# Patient Record
Sex: Male | Born: 1990 | Race: White | Hispanic: No | Marital: Married | Smoking: Never smoker
Health system: Southern US, Community
[De-identification: ages and names within clinical notes are randomized; demographics above are authoritative.]

## PROBLEM LIST (undated history)

## (undated) DIAGNOSIS — F988 Other specified behavioral and emotional disorders with onset usually occurring in childhood and adolescence: Secondary | ICD-10-CM

## (undated) DIAGNOSIS — E785 Hyperlipidemia, unspecified: Secondary | ICD-10-CM

## (undated) DIAGNOSIS — G43909 Migraine, unspecified, not intractable, without status migrainosus: Secondary | ICD-10-CM

## (undated) DIAGNOSIS — E790 Hyperuricemia without signs of inflammatory arthritis and tophaceous disease: Secondary | ICD-10-CM

## (undated) HISTORY — DX: Migraine, unspecified, not intractable, without status migrainosus: G43.909

## (undated) HISTORY — DX: Hyperuricemia without signs of inflammatory arthritis and tophaceous disease: E79.0

## (undated) HISTORY — DX: Other specified behavioral and emotional disorders with onset usually occurring in childhood and adolescence: F98.8

## (undated) HISTORY — DX: Hyperlipidemia, unspecified: E78.5

## (undated) HISTORY — PX: WISDOM TOOTH EXTRACTION: SHX21

---
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
## 2010-04-15 ENCOUNTER — Ambulatory Visit
Admission: RE | Admit: 2010-04-15 | Discharge: 2010-04-15 | Payer: Self-pay | Source: Home / Self Care | Attending: Internal Medicine | Admitting: Internal Medicine

## 2010-04-15 DIAGNOSIS — G43909 Migraine, unspecified, not intractable, without status migrainosus: Secondary | ICD-10-CM | POA: Insufficient documentation

## 2010-04-15 DIAGNOSIS — F988 Other specified behavioral and emotional disorders with onset usually occurring in childhood and adolescence: Secondary | ICD-10-CM | POA: Insufficient documentation

## 2010-04-16 ENCOUNTER — Telehealth: Payer: Self-pay | Admitting: Internal Medicine

## 2010-04-16 ENCOUNTER — Encounter: Payer: Self-pay | Admitting: Internal Medicine

## 2010-05-07 NOTE — Assessment & Plan Note (Signed)
Summary: new to est//migraines//ADHD//lch   Vital Signs:  Patient profile:   20 year old male Height:      72.5 inches Weight:      167.2 pounds BMI:     22.45 Temp:     98.3 degrees F oral Pulse rate:   72 / minute Resp:     14 per minute BP sitting:   116 / 70  (left arm) Cuff size:   large  Vitals Entered By: Shonna Chock CMA (April 15, 2010 3:57 PM) CC: New patient establish: Migraines and ADD, Headaches Comments Patient was on 800mg  Ibuprofen in the past for migraines and vyvanse for ADD   CC:  New patient establish: Migraines and ADD and Headaches.  History of Present Illness:    Tyler Zhang is here to nestablish as a new patient; he is asymptomatic.    He last had migraines last week with  nausea, vomiting, tearing of eyes, sinus pressure, photophobia, and phonophobia, but denies sweats and nasal congestion.  The headache is described as intermittent, throbbing  and sharp.  The location of the pain is unilateral on the left.  The headaches are precipitated by food, cinnamon candy.  Prior treatment has included a NSAID.  Prodrome of tingling in wrists & ankles anf "flashing stars" in vision. He does not typically have an aura.                                                                             He previously as on Vyvanse with good response , but he had excessive sweating. On one occasion he required IV fluids in ER.  Concerta did not help. There have been no issuesas to job performance except for speed of job completion.  Preventive Screening-Counseling & Management  Alcohol-Tobacco     Smoking Status: quit  Caffeine-Diet-Exercise     Does Patient Exercise: yes  Current Medications (verified): 1)  None  Allergies (verified): No Known Drug Allergies  Past History:  Past Medical History: Migraines 2008; ADD diagnosed  by official testing 2008   Past Surgical History: Wisdom Teeth Extraction  Family History: Father: HTN, eevated Lipids Mother: migraines, WPW    Siblings: bro: migraines in HS; PGF: MI , died @ 84; M aunt : Lupus; M uncle: DM; P uncles : HTN  Social History: Occupation: Tile Teacher, adult education Single Former Smoker Alcohol use-yes Regular exercise-yes Smoking Status:  quit Does Patient Exercise:  yes  Review of Systems General:  Denies loss of appetite and sleep disorder; Intermittent fatigue. Eyes:  Denies blurring, double vision, and vision loss-both eyes. ENT:  Denies difficulty swallowing and hoarseness. CV:  Denies lightheadness, near fainting, and palpitations; EKG negative < 4 months ago for exertional  chest pain. GI:  Denies constipation and diarrhea. Derm:  Denies changes in nail beds, dryness, and hair loss. Neuro:  Denies memory loss and numbness. Psych:  Denies anxiety and depression. Endo:  Denies cold intolerance, excessive hunger, excessive thirst, excessive urination, and heat intolerance.  Physical Exam  General:  well-nourished,in no acute distress; alert,appropriate and cooperative throughout examination Head:  Normocephalic and atraumatic without obvious abnormalities. No apparent alopecia or balding. Eyes:  No corneal or conjunctival inflammation noted. EOMI. Perrla.  Field of Vision grossly normal. Mouth:  Oral mucosa and oropharynx without lesions or exudates.  Teeth in good repair. No tongue  Neck:  No deformities, masses, or tenderness noted. Lungs:  Normal respiratory effort, chest expands symmetrically. Lungs are clear to auscultation, no crackles or wheezes. Heart:  Normal rate and regular rhythm. S1 and S2 normal without gallop, murmur,  rub . Click vs loud S4 @ apex Abdomen:  Bowel sounds positive,abdomen soft and non-tender without masses, organomegaly or hernias noted. Pulses:  R and L carotid,radial,femoral,dorsalis pedis and posterior tibial pulses are full and equal bilaterally Extremities:  No clubbing, cyanosis, edema, or deformity noted with normal full range of motion of all joints.   No  onycholysis Neurologic:  alert & oriented X3, cranial nerves II-XII intact, strength normal in all extremities, sensation intact to light touch, and DTRs symmetrical and normal.   Skin:  tatooes chest & LUE Cervical Nodes:  No lymphadenopathy noted Axillary Nodes:  No palpable lymphadenopathy Psych:  memory intact for recent and remote, normally interactive, and good eye contact.     Impression & Recommendations:  Problem # 1:  MIGRAINE HEADACHE (ICD-346.90)  His updated medication list for this problem includes:    Sumatriptan Succinate 100 Mg Tabs (Sumatriptan succinate) .Marland Kitchen... 1 once daily as needed for migraine  Problem # 2:  ADD (ICD-314.00)  Complete Medication List: 1)  Sumatriptan Succinate 100 Mg Tabs (Sumatriptan succinate) .Marland Kitchen.. 1 once daily as needed for migraine 2)  Amphetamine-dextroamphetamine 10 Mg Tabs (Amphetamine-dextroamphetamine) .Marland Kitchen.. 1 once daily as needed , nay repeat 4-6 hrs later  Patient Instructions: 1)  Keep Headache Diary; Excedrin Migraine as needed with PRODROME only. Obtain medical records from ERs. Prescriptions: AMPHETAMINE-DEXTROAMPHETAMINE 10 MG TABS (AMPHETAMINE-DEXTROAMPHETAMINE) 1 once daily as needed , nay repeat 4-6 hrs later  #60 x 0   Entered and Authorized by:   Marga Melnick MD   Signed by:   Marga Melnick MD on 04/15/2010   Method used:   Print then Give to Patient   RxID:   [id number] SUMATRIPTAN SUCCINATE 100 MG TABS (SUMATRIPTAN SUCCINATE) 1 once daily as needed for migraine  #6 x 2   Entered and Authorized by:   Marga Melnick MD   Signed by:   Marga Melnick MD on 04/15/2010   Method used:   Print then Give to Patient   RxID:   [id number] AMPHETAMINE-DEXTROAMPHETAMINE 10 MG TABS (AMPHETAMINE-DEXTROAMPHETAMINE) 1 once daily as needed , nay repeat 4-6 hrs later  #60 x 0   Entered and Authorized by:   Marga Melnick MD   Signed by:   Marga Melnick MD on 04/15/2010   Method used:   Print then Give to Patient   RxID:    [id number] SUMATRIPTAN SUCCINATE 100 MG TABS (SUMATRIPTAN SUCCINATE) 1 once daily as needed for migraine  #6 x 2   Entered and Authorized by:   Marga Melnick MD   Signed by:   Marga Melnick MD on 04/15/2010   Method used:   Print then Give to Patient   RxID:   [id number]    Orders Added: 1)  New Patient Level IV [64332]

## 2010-05-07 NOTE — Medication Information (Signed)
Summary: PA and Approval for Amphetamine Salt Combo  PA and Approval for Amphetamine Salt Combo   Imported By: Maryln Gottron 04/29/2010 15:38:41  _____________________________________________________________________  External Attachment:    Type:   Image     Comment:   External Document

## 2010-05-07 NOTE — Progress Notes (Signed)
Summary: Prior Auth   Phone Note Refill Request Call back at [phone] Message from:  Pharmacy on April 16, 2010 8:40 AM  Refills Requested: Medication #1:  AMPHETAMINE-DEXTROAMPHETAMINE 10 MG TABS 1 once daily as needed   Dosage confirmed as above?Dosage Confirmed Prior Auth from [employer] on [address]   Initial call taken by: Harold Barban,  April 16, 2010 8:40 AM  Follow-up for Phone Call        forms requested. Lucious Groves CMA  April 16, 2010 9:47 AM   Form was completed and faxed, will await insurance company reply Lucious Groves [facility]  April 16, 2010 3:32 PM      Appended Document: Prior Auth  Approved until 04/2011.

## 2010-05-11 ENCOUNTER — Telehealth: Payer: Self-pay | Admitting: Internal Medicine

## 2010-05-12 ENCOUNTER — Telehealth: Payer: Self-pay | Admitting: Internal Medicine

## 2010-05-21 NOTE — Progress Notes (Signed)
Summary: prior auth---Adderall 30mg   Phone Note Refill Request Message from:  Fax from Pharmacy on May 12, 2010 8:55 AM  Refills Requested: Medication #1:  AMPHETAMINE-DEXTROAMPHETAMINE 30 MG XR24H-CAP 1 once daily as needed. prior Berkley Harvey [phone]  - id [id number]  Initial call taken by: Okey Regal [place],  May 12, 2010 8:57 AM  Follow-up for Phone Call        I spoke with insurance company and no additional prior authorization is needed. Previous prior authorization for 10mg  tab was updated. Case #[id number]. Follow-up by: Lucious Groves CMA,  May 12, 2010 9:26 AM

## 2010-05-21 NOTE — Progress Notes (Signed)
Summary: Adderall rx/adjustment  Phone Note Call from Patient Call back at [phone]   Caller: Robinette Haines Summary of Call: Patient mom called noting that the patient Adderall needs to be adjusted and refilled. She notes that he has taking 2 10mg  q AM and 2 10mg  at lunch and it does not last all day. The patient notes that by 4pm he cannot focus.  Patient ran out on Friday. Please advise. Initial call taken by: Lucious Groves CMA,  May 11, 2010 11:28 AM  Follow-up for Phone Call        Am dose increased & changed to sustained release form; short acting form  to stay same Follow-up by: Marga Melnick MD,  May 11, 2010 11:56 AM  Additional Follow-up for Phone Call Additional follow up Details #1::        see Rxs Additional Follow-up by: Marga Melnick MD,  May 11, 2010 11:57 AM    Additional Follow-up for Phone Call Additional follow up Details #2::    Patient mom notified and she will send her mother to come get them for the patient. Follow-up by: Lucious Groves CMA,  May 11, 2010 1:45 PM  New/Updated Medications: AMPHETAMINE-DEXTROAMPHETAMINE 10 MG TABS (AMPHETAMINE-DEXTROAMPHETAMINE) 1- 2 each afternoon  as needed AMPHETAMINE-DEXTROAMPHETAMINE 30 MG XR24H-CAP (AMPHETAMINE-DEXTROAMPHETAMINE) 1 once daily as needed Prescriptions: AMPHETAMINE-DEXTROAMPHETAMINE 10 MG TABS (AMPHETAMINE-DEXTROAMPHETAMINE) 1- 2 each afternoon  as needed  #60 x 0   Entered and Authorized by:   Marga Melnick MD   Signed by:   Marga Melnick MD on 05/11/2010   Method used:   Print then Give to Patient   RxID:   [id number] AMPHETAMINE-DEXTROAMPHETAMINE 30 MG XR24H-CAP (AMPHETAMINE-DEXTROAMPHETAMINE) 1 once daily as needed  #30 x 0   Entered and Authorized by:   Marga Melnick MD   Signed by:   Marga Melnick MD on 05/11/2010   Method used:   Print then Give to Patient   RxID:   [id number]

## 2010-05-28 ENCOUNTER — Encounter: Payer: Self-pay | Admitting: Internal Medicine

## 2010-05-28 ENCOUNTER — Other Ambulatory Visit: Payer: Self-pay | Admitting: Internal Medicine

## 2010-05-28 ENCOUNTER — Ambulatory Visit (INDEPENDENT_AMBULATORY_CARE_PROVIDER_SITE_OTHER): Payer: BC Managed Care – PPO | Admitting: Internal Medicine

## 2010-05-28 DIAGNOSIS — F988 Other specified behavioral and emotional disorders with onset usually occurring in childhood and adolescence: Secondary | ICD-10-CM

## 2010-05-28 DIAGNOSIS — R079 Chest pain, unspecified: Secondary | ICD-10-CM | POA: Insufficient documentation

## 2010-05-28 DIAGNOSIS — K219 Gastro-esophageal reflux disease without esophagitis: Secondary | ICD-10-CM

## 2010-05-28 DIAGNOSIS — Z8249 Family history of ischemic heart disease and other diseases of the circulatory system: Secondary | ICD-10-CM

## 2010-05-28 DIAGNOSIS — G43909 Migraine, unspecified, not intractable, without status migrainosus: Secondary | ICD-10-CM

## 2010-05-29 LAB — CONVERTED CEMR LAB: Troponin I: <0.01 ng/mL (ref <0.06)

## 2010-06-01 ENCOUNTER — Other Ambulatory Visit: Payer: BC Managed Care – PPO

## 2010-06-02 NOTE — Assessment & Plan Note (Signed)
Summary: Chest/Rib Pain/ cbs   Vital Signs:  Patient profile:   20 year old male Weight:      164.4 pounds BMI:     22.07 Temp:     98.1 degrees F oral Pulse rate:   92 / minute Resp:     14 per minute BP sitting:   128 / 88  (right arm) Cuff size:   large  Vitals Entered By: Shonna Chock CMA (May 28, 2010 2:15 PM) CC: 1.) Rib pain /chest pain x several years, pain worse x 1 month . Pain will onset for weeks at a time then go away.  2.) Lip concerns-bleeding x 2-3 weeks off/on and painful , Heartburn   CC:  1.) Rib pain /chest pain x several years, pain worse x 1 month . Pain will onset for weeks at a time then go away.  2.) Lip concerns-bleeding x 2-3 weeks off/on and painful , and Heartburn.  History of Present Illness:    Exacerbation  in early 04/2010 when pain became daily phenomenon ; he has had intermittent less severe pain  for 3-4 years , assumed to be from  high school football injury. The 12/[address] ER record reviewed ; that episode was associated with SOB  &  cp radiating to LUE during exercise. Now the pain is described as intermittent and pressure-like  in the substernal area.  The pain radiates to the back where it is burning.  Episodes of chest pain last  hours.  The pain is brought on or made worse by upper body movement, especially waist flexion .  The pain is relieved or improved with  ice to chest & back.His mother has had WPW; father HTN  & PGF MI @ 20. No syndrome of cp, flushing , headache & diarrhea.    He also  describes heartburn.  The patient reports acid reflux and   trouble swallowing 2 X/ week on average , but denies epigastric pain.  The patient denies the following alarm features: melena, dysphagia, hematemesis, and vomiting.                                                                                                                                He has had some burning inside his lower lip w/o visable  tissue change. He uses smokeless tobacco  regularly.  Current Medications (verified): 1)  Sumatriptan Succinate 100 Mg Tabs (Sumatriptan Succinate) .Marland Kitchen.. 1 Once Daily As Needed For Migraine 2)  Amphetamine-Dextroamphetamine 10 Mg Tabs (Amphetamine-Dextroamphetamine) .Marland Kitchen.. 1- 2 Each Afternoon  As Needed 3)  Amphetamine-Dextroamphetamine 30 Mg Xr24h-Cap (Amphetamine-Dextroamphetamine) .Marland Kitchen.. 1 Once Daily As Needed  Allergies (verified): No Known Drug Allergies  Family History: Father: HTN, elevated Lipids Mother: migraines, WPW  Siblings: bro: migraines in HS; PGF: MI , died @ 1; M aunt : Lupus; M uncle: DM; P uncles : HTN  Physical Exam  General:  Thin but well-nourished,in no acute distress;  alert,appropriate and cooperative throughout examination Eyes:  No corneal or conjunctival inflammation noted. EOMI. Perrla. No icterus Mouth:  Oral mucosa and oropharynx without lesions or exudates.  Teeth in good repair. No pharyngeal erythema.  No lip tissue changes Neck:  No deformities, masses, or tenderness noted. Lungs:  Normal respiratory effort, chest expands symmetrically. Lungs are clear to auscultation, no crackles or wheezes. Heart:  Normal rate and regular rhythm. S1 and S2 normal without gallop, murmur,  rub . Mitral click  suggested @ apex. Abdomen:  Bowel sounds positive,abdomen soft and non-tender without masses, organomegaly or hernias noted. Pulses:  R and L carotid,radial,dorsalis pedis and posterior tibial pulses are full and equal bilaterally Extremities:  No clubbing, cyanosis, edema, or deformity noted . Neg Homan's Neurologic:  alert & oriented X3.   Skin:  Intact without suspicious lesions or rashes. No jaundice Cervical Nodes:  No lymphadenopathy noted Axillary Nodes:  No palpable lymphadenopathy Psych:  memory intact for recent and remote, normally interactive, and good eye contact.     Impression & Recommendations:  Problem # 1:  CHEST PAIN (ICD-786.50)  ? Mitral  Click Syndrome  Orders: EKG w/  Interpretation (93000) Venipuncture (16109) TLB-Cardiac Panel ([id number]) T- * Misc. Laboratory test [phone]) Specimen Handling (21308)  Problem # 2:  GERD (ICD-530.81) ? aggravated by smokeless tobacco & caffeine [facility]) His updated medication list for this problem includes:    Omeprazole 20 Mg Cpdr (Omeprazole) .Marland Kitchen... 1 two times a day pre meals  Problem # 3:  MYOCARDIAL INFARCTION, PREMATURE, FAMILY HX (ICD-V17.3) PGF @ age 53  Problem # 4:  ADD (ICD-314.00)  Problem # 5:  MIGRAINE HEADACHE (ICD-346.90)  His updated medication list for this problem includes:    Sumatriptan Succinate 100 Mg Tabs (Sumatriptan succinate) .Marland Kitchen... 1 once daily as needed for migraine  Complete Medication List: 1)  Sumatriptan Succinate 100 Mg Tabs (Sumatriptan succinate) .Marland Kitchen.. 1 once daily as needed for migraine 2)  Amphetamine-dextroamphetamine 10 Mg Tabs (Amphetamine-dextroamphetamine) .Marland Kitchen.. 1- 2 each afternoon  as needed 3)  Amphetamine-dextroamphetamine 30 Mg Xr24h-cap (Amphetamine-dextroamphetamine) .Marland Kitchen.. 1 once daily as needed 4)  Omeprazole 20 Mg Cpdr (Omeprazole) .Marland Kitchen.. 1 two times a day pre meals  Patient Instructions: 1)  Schedule  a fasting [place] [facility] panel ( 786.50, V17.3). 2)  Avoid foods high in acid (tomatoes, citrus juices, spicy foods). Avoid eating within two hours of lying down or before exercising. Do not over eat; try smaller more frequent meals. Elevate head of bed twelve inches when sleeping. See Dentist every 6 months; please consider health risks of smokeless tobacco as discussed . Prescriptions: OMEPRAZOLE 20 MG CPDR (OMEPRAZOLE) 1 two times a day pre meals  #60 x 1   Entered and Authorized by:   Marga Melnick MD   Signed by:   Marga Melnick MD on 05/28/2010   Method used:   Print then Give to Patient   RxID:   [id number]    Orders Added: 1)  Est. Patient Level IV [24401] 2)  EKG w/ Interpretation [93000] 3)  Venipuncture [36415] 4)  TLB-Cardiac Panel  [[id number]] 5)  T- * Misc. Laboratory test [id number] 6)  Specimen Handling [99000]

## 2010-06-18 LAB — CBC
HCT: 42.3 % (ref 39.0–52.0)
MCV: 92.7 fL (ref 78.0–100.0)
Platelets: 204 10*3/uL (ref 150–400)
RBC: 4.56 MIL/uL (ref 4.22–5.81)
WBC: 12.4 10*3/uL — ABNORMAL HIGH (ref 4.0–10.5)

## 2010-06-18 LAB — POCT TOXICOLOGY PANEL

## 2010-06-18 LAB — BASIC METABOLIC PANEL
Chloride: 100 mEq/L (ref 96–112)
GFR calc Af Amer: >60 mL/min (ref >60)
GFR calc non Af Amer: >60 mL/min (ref >60)
Potassium: 4.2 mEq/L (ref 3.5–5.1)

## 2010-06-18 LAB — POCT CARDIAC MARKERS
CKMB, poc: 2.2 ng/mL (ref 1.0–8.0)
Troponin i, poc: <0.05 ng/mL (ref 0.00–0.09)

## 2010-06-18 LAB — DIFFERENTIAL
Eosinophils Relative: 1 % (ref 0–5)
Lymphocytes Relative: 20 % (ref 12–46)
Lymphs Abs: 2.5 10*3/uL (ref 0.7–4.0)
Neutro Abs: 8.6 10*3/uL — ABNORMAL HIGH (ref 1.7–7.7)

## 2010-06-25 ENCOUNTER — Encounter: Payer: Self-pay | Admitting: Internal Medicine

## 2010-07-01 ENCOUNTER — Ambulatory Visit: Payer: BC Managed Care – PPO | Admitting: Internal Medicine

## 2010-07-04 ENCOUNTER — Encounter: Payer: Self-pay | Admitting: Internal Medicine

## 2010-07-06 ENCOUNTER — Ambulatory Visit (INDEPENDENT_AMBULATORY_CARE_PROVIDER_SITE_OTHER): Payer: BC Managed Care – PPO | Admitting: Internal Medicine

## 2010-07-06 ENCOUNTER — Encounter: Payer: Self-pay | Admitting: Internal Medicine

## 2010-07-06 DIAGNOSIS — Z8249 Family history of ischemic heart disease and other diseases of the circulatory system: Secondary | ICD-10-CM

## 2010-07-06 DIAGNOSIS — R079 Chest pain, unspecified: Secondary | ICD-10-CM

## 2010-07-06 DIAGNOSIS — E785 Hyperlipidemia, unspecified: Secondary | ICD-10-CM

## 2010-07-06 MED ORDER — NIACIN ER (ANTIHYPERLIPIDEMIC) 500 MG PO TBCR: 500.0000 mg | EXTENDED_RELEASE_TABLET | Freq: Every day | ORAL | Status: DC

## 2010-07-06 MED ORDER — AMPHETAMINE-DEXTROAMPHETAMINE 10 MG PO TABS: 10.0000 mg | ORAL_TABLET | Freq: Two times a day (BID) | ORAL | Status: DC

## 2010-07-06 MED ORDER — AMPHETAMINE-DEXTROAMPHET ER 30 MG PO CP24: 30.0000 mg | ORAL_CAPSULE | Freq: Every day | ORAL | Status: DC | PRN

## 2010-07-06 NOTE — Progress Notes (Signed)
  Subjective:    Patient ID: Tyler Zhang, male    DOB: 17-Jun-1990, 20 y.o.   MRN: [id number]  HPI He returns to review his [place] [facility] diagnostics panel done to assess long-term cardiovascular risk. This was performed because of a history of atypical recurrent chest pain. Additionally there is an incredibly strong family history  Of premature coronary  artery disease. Specifically his paternal grandfather and 3 paternal great uncles had heart attacks in their 30s.   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 Additionally his uric acid is 9.1 which would be associated with increased risk long term of gout. LDL 73; HDL was 77.   The chest pain continues; he actually needed to apply an ice pack to his chest last night because of discomfort      Review of Systems     Objective:   Physical Exam Heart:  Normal rate and regular rhythm. S1 and S2 normal without gallop, murmur, click, rub .S4  vs  Mitral Click.  No cyanosis, edema, or deformity noted with good pulses.                      Lungs:Chest clear to auscultation; no wheezes, rhonchi,rales ,or rubs present.No increased work of breathing.         Assessment & Plan:  #1 atypical chest pain; today there is suggestion of a possible mitral click  #2 strong family history of premature coronary artery disease with premature death.  #3 elevated Lp(a)  Elevated uric acid  Plan; the role of-induced corn syrup sugar raising uric acid was discussed. A stress echo will be performed to rule out asymptomatic mitral valve prolapse.  Niacin as Niaspan 500 mg daily will be initiated to treat the single risk.

## 2010-07-06 NOTE — Progress Notes (Signed)
Addended by: Stephan Minister on: 07/06/2010 04:08 PM   Modules accepted: Orders

## 2010-07-06 NOTE — Patient Instructions (Signed)
The most common cause of elevated uric acid  is the ingestion of sugar from high fructose corn syrup sources. You should consume less than 40 grams  of sugar per day from foods and drinks with high fructose corn syrup as number 2, 3, or #4 on the label.   Please read the information about Niaspan in detail and decide if he want to take this agent. Will need to be taken with applesauce to prevent flushing which is an expected effect of this vitamin which is a vasodilator.

## 2010-07-15 ENCOUNTER — Telehealth: Payer: Self-pay | Admitting: *Deleted

## 2010-07-15 NOTE — Telephone Encounter (Signed)
Was assisting w/ prior auth for stress test insurance will need for Dr. Alwyn Ren to do a peer to peer before services will be approved the are open til 7pm central time. Pt's appt is scheduled for Friday, April 13th.   [phone] opt 2 has be done before appt.

## 2010-07-15 NOTE — Telephone Encounter (Signed)
The manage care currently has the office notes with the full record &  diagnoses. There is nothing else I can tell them  Which would  Change their  mind. I recommend cardiology evaluation by a physician of their choice.

## 2010-07-17 ENCOUNTER — Other Ambulatory Visit (HOSPITAL_COMMUNITY): Payer: BC Managed Care – PPO | Admitting: Radiology

## 2010-07-17 ENCOUNTER — Ambulatory Visit (INDEPENDENT_AMBULATORY_CARE_PROVIDER_SITE_OTHER): Payer: BC Managed Care – PPO | Admitting: Internal Medicine

## 2010-07-17 ENCOUNTER — Encounter: Payer: Self-pay | Admitting: Internal Medicine

## 2010-07-17 DIAGNOSIS — R000 Tachycardia, unspecified: Secondary | ICD-10-CM

## 2010-07-17 DIAGNOSIS — R079 Chest pain, unspecified: Secondary | ICD-10-CM

## 2010-07-17 MED ORDER — METOPROLOL TARTRATE 25 MG PO TABS: 25.0000 mg | ORAL_TABLET | Freq: Two times a day (BID) | ORAL | Status: AC

## 2010-07-17 NOTE — Patient Instructions (Signed)
Please take the metoprolol 25 mg twice a day as a trial. Please stop it 24 hours before you see the cardiologist. (Note: [phone])

## 2010-07-17 NOTE — Progress Notes (Signed)
  Subjective:    Patient ID: Tyler Zhang, male    DOB: Apr 01, 1991, 20 y.o.   MRN: [id number]  HPI CHEST PAIN  Location: LSB Quality: sharp, aching Duration: "all day 4/12" Onset (rest, exertion): @ rest Radiation: no Better with: no relieveers Worse with: no exacerbating factors  Symptoms  Nausea/vomiting: no  Diaphoresis: no  Shortness of breath: no  Pleuritic: no  Cough: no  Edema: no  Orthopnea: no  PND: no  Dizziness: yes, occassionally with standing  Palpitations: yes, lasted for 2 hrs  Syncope: no  Indigestion: no   Red Flags Worse with exertion: no  Recent Immobility: no  Tearing/radiation to back: no       Review of Systems     Objective:   Physical Exam   He is in no acute distress. Skin is warm and dry.  He has no scleral icterus.  An S4 versus a mitral click is noted at the apex. Chest is clear to auscultation.  All pulses are intact without bruits or deficits.  Homans sign is negative.  Extremities reveal no cyanosis, clubbing, or edema.  There is no chest wall tenderness to palpation.          Assessment & Plan:  #1 atypical chest pain; rule out significant mitral valve prolapse  #2 palpitations lasting hours  Plan: I had requested a stress echo to assess the atypical chest pain and possibly mitral prolapse. Because the preauthorization was required despite submitting complete office notes, I ordered a cardiology consult.  Pending completion of that, I would recommend low-dose beta blockade as a trial.   a cardiac panel will be performed to include CK, CK-MB and troponin 1. Additionally d-dimer will be rechecked.  Cardiology consult was scheduled on April 25 with Dr. Eden Emms

## 2010-07-18 LAB — CK TOTAL AND CKMB (NOT AT ARMC)
CK, MB: 2 ng/mL (ref 0.3–4.0)
Relative Index: 1.7 (ref 0.0–2.5)
Total CK: 117 U/L (ref 7–232)

## 2010-07-18 LAB — TROPONIN I: Troponin I: <0.01 ng/mL (ref <0.06)

## 2010-07-29 ENCOUNTER — Institutional Professional Consult (permissible substitution): Payer: BC Managed Care – PPO | Admitting: Cardiovascular Disease

## 2010-08-12 ENCOUNTER — Encounter: Payer: Self-pay | Admitting: Cardiovascular Disease

## 2010-08-13 ENCOUNTER — Institutional Professional Consult (permissible substitution): Payer: BC Managed Care – PPO | Admitting: Cardiovascular Disease

## 2010-08-27 ENCOUNTER — Ambulatory Visit (INDEPENDENT_AMBULATORY_CARE_PROVIDER_SITE_OTHER): Payer: BC Managed Care – PPO | Admitting: Cardiovascular Disease

## 2010-08-27 ENCOUNTER — Encounter: Payer: Self-pay | Admitting: Cardiovascular Disease

## 2010-08-27 DIAGNOSIS — R079 Chest pain, unspecified: Secondary | ICD-10-CM

## 2010-08-27 NOTE — Assessment & Plan Note (Signed)
Atypical.  I dont hear click or murmur.  ETT and Echo.  Echo to be done by [facility] pediatric tech to see if origins of coronary arteries can be seen.  Other option would be cardiac CT with [facility].  Will see what insurance says and what we can do

## 2010-08-27 NOTE — Progress Notes (Signed)
20 yo referred by Dr Alwyn Ren.  Atypical SSCP over a year.  Can last all day.  No pleuritic component.  Not positional.  No history of trauma.  CXR reviewed from 9/11 normal.  No history of arthritites or connective tissue disease.  Dr Alwyn Ren ? MVP with click and wanted to order a stress echo but denied by insurance. No SOB.  More sedentary last 4 months but no exercise limitations at [employer].  No syncope.  Family history of premature CAD.  Denies drugs, etoh or excessive stimulants.  W/U in ER 9/11 for chest pain negative.  Reviewed ECG from Dr Caryl Never office and it was normal  ROS: Denies fever, malais, weight loss, blurry vision, decreased visual acuity, cough, sputum, SOB, hemoptysis, pleuritic pain, palpitaitons, heartburn, abdominal pain, melena, lower extremity edema, claudication, or rash.   General: Affect appropriate Healthy:  appears stated age HEENT: normal Neck supple with no adenopathy JVP normal no bruits no thyromegaly Lungs clear with no wheezing and good diaphragmatic motion Heart:  S1/S2 no murmur,rub, gallop or click PMI normal Abdomen: benighn, BS positve, no tenderness, no AAA no bruit.  No HSM or HJR Distal pulses intact with no bruits No edema Neuro non-focal Skin warm and dry No muscular weakness  Medications Current Outpatient Prescriptions  Medication Sig Dispense Refill  . amphetamine-dextroamphetamine (ADDERALL XR) 30 MG 24 hr capsule Take 30 mg by mouth daily.        Marland Kitchen amphetamine-dextroamphetamine (ADDERALL) 10 MG tablet Take 1 tablet (10 mg total) by mouth 2 (two) times daily. 1-2 by mouth each afternoon as needed  60 tablet  0  . omeprazole (PRILOSEC) 20 MG capsule Take 20 mg by mouth as needed.       . SUMAtriptan (IMITREX) 100 MG tablet Take 100 mg by mouth daily as needed.        Marland Kitchen DISCONTD: amphetamine-dextroamphetamine (ADDERALL XR) 30 MG 24 hr capsule Take 1 capsule (30 mg total) by mouth daily as needed.  30 capsule  0  . DISCONTD: niacin (NIASPAN) 500  MG CR tablet Take 1 tablet (500 mg total) by mouth at bedtime.  30 tablet  11    Allergies Review of patient's allergies indicates no known allergies.  Family History: Family History  Problem Relation Age of Onset  . Hypertension Father   . Hyperlipidemia Father   . Migraines Mother   . Evelene Croon Parkinson White syndrome Mother   . Migraines Brother   . Heart attack Paternal Grandfather 43  . Lupus Maternal Aunt   . Diabetes Maternal Uncle   . Hypertension Paternal Uncle   . Heart attack      3 PG uncles in their 30s    Social History: History   Social History  . Marital Status: Single    Spouse Name: N/A    Number of Children: N/A  . Years of Education: N/A   Occupational History  . Not on file.   Social History Main Topics  . Smoking status: Never Smoker   . Smokeless tobacco: Not on file  . Alcohol Use: Yes  . Drug Use: No  . Sexually Active: Not on file   Other Topics Concern  . Not on file   Social History Narrative  . No narrative on file     Electrocardiogram:  NSR normal  07/17/10  Assessment and Plan

## 2010-08-27 NOTE — Patient Instructions (Signed)
Schedule an appointment for an echocardiogram.  This should be scheduled with the pediatric echo tech.  Schedule an appointment for a treadmill. This should be with Dr Eden Emms next week.

## 2010-09-16 ENCOUNTER — Ambulatory Visit (INDEPENDENT_AMBULATORY_CARE_PROVIDER_SITE_OTHER): Payer: BC Managed Care – PPO | Admitting: Cardiovascular Disease

## 2010-09-16 DIAGNOSIS — R079 Chest pain, unspecified: Secondary | ICD-10-CM

## 2010-09-16 NOTE — Progress Notes (Signed)
Exercise Treadmill Test  Pre-Exercise Testing Evaluation Rhythm: normal sinus  Rate: 88   PR:  .13 QRS:  .09  QT:  .35 QTc: .43     Test  Exercise Tolerance Test Ordering MD: Charlton Haws, MD  Interpreting MD:  Charlton Haws, MD  Unique Test No: 1  Treadmill:  1  Indication for ETT: chest pain - rule out ischemia  Contraindication to ETT: No   Stress Modality: exercise - treadmill  Cardiac Imaging Performed: non   Protocol: standard Bruce - maximal  Max BP: 128 74  Max MPHR (bpm):  200 85% MPR (bpm):  170  MPHR obtained (bpm):  184 % MPHR obtained:  100  Reached 85% MPHR (min:sec):  8:10 Total Exercise Time (min-sec):  10:00  Workload in METS:  17.2 Borg Scale: 19  Reason ETT Terminated:  fatigue    ST Segment Analysis At Rest: normal ST segments - no evidence of significant ST depression With Exercise: no evidence of significant ST depression  Other Information Arrhythmia:  No Angina during ETT:  absent (0) Quality of ETT:  diagnostic  ETT Interpretation:  normal - no evidence of ischemia by ST analysis Stages advanced at 2 minute intervals  Comments: Normal ETT at good work load  Recommendations: F/U with Echo latter today  Charlton Haws

## 2010-10-06 ENCOUNTER — Encounter: Payer: Self-pay | Admitting: Cardiovascular Disease

## 2010-10-16 ENCOUNTER — Telehealth: Payer: Self-pay | Admitting: Internal Medicine

## 2010-10-16 NOTE — Telephone Encounter (Signed)
Left message on voicemail informing patient's mother that she or Yasin needs to get the ok from Dr.Nishan that patient is ok to take adderall. Dr.Hopper will fill med once that is clarified from the cardiologist .  Patient 's mother to call our office back if questions or concerns

## 2010-10-16 NOTE — Telephone Encounter (Signed)
Dr.Hopper please advise ( I see that patient recently seen Tyler Zhang for Chest pain) ? If ok to fill adderall

## 2010-10-16 NOTE — Telephone Encounter (Signed)
I am very sorry that the Adderall cannot be refilled until this is okayed by Dr. Eden Emms in a, the cardiologist who saw him for chest pain.

## 2010-10-16 NOTE — Telephone Encounter (Signed)
Refill adderall 30 mg & 10 mg - patient hasnt had refill because he lost his job - but now back to work - patient will pick up Monday [id number]

## 2010-10-19 ENCOUNTER — Telehealth: Payer: Self-pay | Admitting: Cardiovascular Disease

## 2010-10-19 NOTE — Telephone Encounter (Signed)
Pt's mother called and per Dr. Alwyn Ren pt has had some testing done under Dr. Fabio Bering care and they need the notes from the test and a note forwarded to him stating it is ok to refill pt's meds

## 2010-10-20 NOTE — Telephone Encounter (Signed)
Spoke with pt, dr hopper's office needs an okay from dr Eden Emms to refill the pts adderall.  Will forward for dr Eden Emms review  Deliah Goody

## 2010-10-20 NOTE — Telephone Encounter (Signed)
Ok to refill addeerall

## 2010-10-20 NOTE — Telephone Encounter (Signed)
Okay to refill Adderall? 

## 2010-10-21 MED ORDER — AMPHETAMINE-DEXTROAMPHET ER 30 MG PO CP24: 30.0000 mg | ORAL_CAPSULE | Freq: Every day | ORAL | Status: DC

## 2010-10-21 MED ORDER — AMPHETAMINE-DEXTROAMPHETAMINE 10 MG PO TABS: 10.0000 mg | ORAL_TABLET | Freq: Two times a day (BID) | ORAL | Status: DC

## 2010-10-21 NOTE — Telephone Encounter (Signed)
Left message on mother's voicemail: RX's will be available for pick-up after 9am. Patient's mother to call if questions or concerns

## 2010-11-16 ENCOUNTER — Other Ambulatory Visit: Payer: Self-pay | Admitting: Orthopedic Surgery

## 2010-11-16 ENCOUNTER — Emergency Department (HOSPITAL_COMMUNITY): Payer: BC Managed Care – PPO

## 2010-11-16 ENCOUNTER — Observation Stay (HOSPITAL_COMMUNITY)
Admission: EM | Admit: 2010-11-16 | Discharge: 2010-11-16 | Disposition: A | Discharge status: Home / Self Care | Payer: BC Managed Care – PPO | Attending: Orthopedic Surgery | Admitting: Orthopedic Surgery

## 2010-11-16 DIAGNOSIS — Y9269 Other specified industrial and construction area as the place of occurrence of the external cause: Secondary | ICD-10-CM | POA: Insufficient documentation

## 2010-11-16 DIAGNOSIS — W298XXA Contact with other powered powered hand tools and household machinery, initial encounter: Secondary | ICD-10-CM | POA: Insufficient documentation

## 2010-11-16 DIAGNOSIS — IMO0002 Reserved for concepts with insufficient information to code with codable children: Principal | ICD-10-CM | POA: Insufficient documentation

## 2010-11-16 DIAGNOSIS — Y990 Civilian activity done for income or pay: Secondary | ICD-10-CM | POA: Insufficient documentation

## 2010-11-16 DIAGNOSIS — Z01812 Encounter for preprocedural laboratory examination: Secondary | ICD-10-CM | POA: Insufficient documentation

## 2010-11-16 LAB — CBC
MCH: 31 pg (ref 26.0–34.0)
MCV: 89.4 fL (ref 78.0–100.0)
Platelets: 292 10*3/uL (ref 150–400)
RDW: 12.8 % (ref 11.5–15.5)

## 2010-11-16 LAB — DIFFERENTIAL
Eosinophils Absolute: 0.2 10*3/uL (ref 0.0–0.7)
Eosinophils Relative: 2 % (ref 0–5)
Lymphs Abs: 2.4 10*3/uL (ref 0.7–4.0)
Monocytes Relative: 8 % (ref 3–12)

## 2010-11-16 LAB — BASIC METABOLIC PANEL
CO2: 30 mEq/L (ref 19–32)
Calcium: 9.6 mg/dL (ref 8.4–10.5)
Creatinine, Ser: 1.04 mg/dL (ref 0.50–1.35)

## 2010-12-01 NOTE — Consult Note (Signed)
  NAMETLALOC, TADDEI             ACCOUNT NO.:  [id number]  MEDICAL RECORD NO.:  [id number]  LOCATION:  2550                         FACILITY:  [facility]  PHYSICIAN:  Artist Pais. 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  He is 20 years old.  He has no known drug allergies.  No current medications.  No recent hospitalizations or surgery.  FAMILY MEDICAL HISTORY:  Noncontributory.  SOCIAL HISTORY:  Noncontributory.  He currently takes Adderall on a daily basis.  PHYSICAL EXAMINATION:  Examination reveals an amputated long finger at the middle phalangeal level oblique in nature with ribbon sign of the digital arteries and significant damage to the soft tissues.  X-rays show amputation through the base of the middle phalanx of the left long finger.  The patient is advised that single digit amputations using Iantha Fallen replantation at [facility] was consulted, they agreed that transfer was not indicated for a single digit amputation at this level on the nondominant hand, therefore after thorough discussion with family, we will proceed with revision amputation of the left long finger today at [facility] as an outpatient with a possible 23-hour stay depending on pain level.  This was discussed in great detail with both [place] and his family and they agree to continue care here in [place].     Artist Pais Mina Marble, M.D.    MAW/MEDQ  D:  11/16/2010  T:  11/17/2010  Job:  [id number]  Electronically Signed by Dairl Ponder M.D. on 12/01/2010 09:36:09 AM

## 2010-12-01 NOTE — Op Note (Signed)
  NAMELENNYN, Tyler Zhang             ACCOUNT NO.:  [id number]  MEDICAL RECORD NO.:  [id number]  LOCATION:  2550                         FACILITY:  [facility]  PHYSICIAN:  Artist Pais. 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  PROCEDURE:  Revision amputation with bilateral neurectomies and primary skin closure.  SURGEON:  Artist Pais. Mina Marble, MD  ASSISTANT:  None.  ANESTHESIA:  General.  TOURNIQUET TIME:  Twenty-seven minutes.  No complications.  No drains.  No specimens sent.  The patient was taken to operating suite.  After induction of adequate general anesthesia, left upper thigh was prepped and draped in sterile fashion.  An Esmarch was used to exsanguinate the limb.  Tourniquet was inflated to 250 mmHg.  At this point in time, traumatic amputation of the left long finger was approached surgically.  There was a significant soft tissue damage to the proximal part as well as distal part which was examined preoperatively.  Replantation was not indicated due single digit and significant damage to both proximal and distal aspects. Revision amputation was undertaken.  The remaining base of the middle phalanx was carefully subperiosteally dissected free.  Bilateral digital nerve neurectomies were performed followed by advancement of the dorsal skin over the tip of the head of the proximal phalanx with some debridement of nonviable skin.  Closure was obtained with no undue pressure using 4-0 Vicryl Rapide sutures.  Xeroform, 4 x 4's and compressive wrap was applied.  The patient tolerated well and went to recovery in stable fashion.     Artist Pais Mina Marble, M.D.     MAW/MEDQ   D:  11/16/2010  T:  11/17/2010  Job:  [id number]  Electronically Signed by Dairl Ponder M.D. on 12/01/2010 09:36:11 AM

## 2010-12-16 NOTE — Discharge Summary (Signed)
  NAMELANARD, ARGUIJO             ACCOUNT NO.:  [id number]  MEDICAL RECORD NO.:  [id number]  LOCATION:  2550                         FACILITY:  [facility]  PHYSICIAN:  Artist Pais. Bryla Burek, M.D.DATE OF BIRTH:  12-Jul-1990  DATE OF ADMISSION:  11/16/2010 DATE OF DISCHARGE:  11/16/2010                              DISCHARGE SUMMARY   Mr. Tyler Zhang went home the same night of surgery.  There is no discharge summary necessary.  The ER staff unfortunately got him in the inpatient status.  This needs to be changed on his hospital chart as he was a less than 23-hour stay at [facility].     Molli Hazard A. Mina Marble, M.D.     MAW/MEDQ  D:  12/01/2010  T:  12/01/2010  Job:  [id number]  Electronically Signed by Dairl Ponder M.D. on 12/16/2010 08:44:33 AM

## 2011-01-13 LAB — I-STAT 8, (EC8 V) (CONVERTED LAB)
BUN: 13
Bicarbonate: 23.6
Glucose, Bld: 116 — ABNORMAL HIGH
Hemoglobin: 13.3
Sodium: 136
TCO2: 24
pH, Ven: 7.534 — ABNORMAL HIGH

## 2011-01-13 LAB — URINALYSIS, ROUTINE W REFLEX MICROSCOPIC
Hgb urine dipstick: NEGATIVE
Nitrite: NEGATIVE
Specific Gravity, Urine: 1.004 — ABNORMAL LOW
Urobilinogen, UA: 0.2
pH: 7

## 2011-01-13 LAB — BASIC METABOLIC PANEL
BUN: 13
CO2: 26
Calcium: 9.6
Creatinine, Ser: 1.17
Glucose, Bld: 114 — ABNORMAL HIGH
Sodium: 136

## 2011-01-13 LAB — RAPID URINE DRUG SCREEN, HOSP PERFORMED
Barbiturates: NOT DETECTED
Cocaine: NOT DETECTED
Opiates: NOT DETECTED

## 2011-05-06 ENCOUNTER — Ambulatory Visit (INDEPENDENT_AMBULATORY_CARE_PROVIDER_SITE_OTHER): Payer: BC Managed Care – PPO | Admitting: Internal Medicine

## 2011-05-06 ENCOUNTER — Encounter: Payer: Self-pay | Admitting: Internal Medicine

## 2011-05-06 VITALS — BP 126/80 | HR 84 | Temp 98.2°F | Wt 163.8 lb

## 2011-05-06 DIAGNOSIS — J0100 Acute maxillary sinusitis, unspecified: Secondary | ICD-10-CM

## 2011-05-06 MED ORDER — AMOXICILLIN 500 MG PO CAPS: 500.0000 mg | ORAL_CAPSULE | Freq: Three times a day (TID) | ORAL | Status: AC

## 2011-05-06 MED ORDER — FLUTICASONE PROPIONATE 50 MCG/ACT NA SUSP: 1.0000 | Freq: Two times a day (BID) | NASAL | Status: DC | PRN

## 2011-05-06 NOTE — Patient Instructions (Signed)
Zicam Melts or Zinc lozenges ; vitamin C 2000 mg daily; & Echinacea for 4-7 days. Plain Mucinex for thick secretions ;force NON dairy fluids. Use a Neti pot daily as needed for sinus congestion . Fluticasone 1 spray in each nostril twice a day as needed. Use the "crossover" technique as discussed

## 2011-05-06 NOTE — Progress Notes (Signed)
  Subjective:    Patient ID: Tyler Zhang, male    DOB: [birth year], 21 y.o.   MRN: [id number]  HPI Respiratory tract infection Onset/symptoms:1/29 as ST Exposures (illness/environmental/extrinsic):no Progression of symptoms:to sinus congestion Treatments/response:fluids, OTC cold meds/ / some Present symptoms: Fever/chills/sweats:no Frontal headache:no Facial pain:yes Nasal purulence:green Sore throat:scratchy Dental pain:no Lymphadenopathy:no Cough/sputum/hemoptysis:no  Smoking history:never           Review of Systems     Objective:   Physical Exam General appearance: thin, well muscled;good health ;well nourished; no acute distress or increased work of breathing is present.  No  lymphadenopathy about the head, neck, or axilla noted.   Eyes: No conjunctival inflammation or lid edema is present.   Ears:  External ear exam shows no significant lesions or deformities.  Otoscopic examination reveals clear canals, tympanic membranes are intact bilaterally without bulging, retraction, inflammation or discharge.  Nose:  External nasal examination shows no deformity or inflammation. Nasal mucosa : mild erythema without lesions or exudates. No septal dislocation or deviation.No obstruction to airflow.   Oral exam: Dental hygiene is good; lips and gums are healthy appearing.There is no oropharyngeal erythema or exudate noted.     Heart:  Normal rate and regular rhythm. S1 and S2 normal without gallop, murmur, click, rub or other extra sounds.   Lungs:Chest clear to auscultation; no wheezes, rhonchi,rales ,or rubs present.No increased work of breathing.    Extremities:  No cyanosis, edema, or clubbing  noted    Skin: Warm & dry        Assessment & Plan:  #1 maxillary sinusitis  Plan: See orders and recommendations

## 2011-05-10 ENCOUNTER — Telehealth: Payer: Self-pay | Admitting: *Deleted

## 2011-05-10 NOTE — Telephone Encounter (Signed)
Left message to call office

## 2011-05-10 NOTE — Telephone Encounter (Signed)
[facility] Triage Call Report Triage Record Num: [id number] Operator: Lodema Pilot Patient Name: Tyler Zhang Call Date & Time: 05/08/2011 9:42:25AM Patient Phone: [phone] PCP: Marga Melnick Patient Gender: Male PCP Fax : [phone] Patient DOB: 1990/10/11 Practice Name: [facility] Reason for Call: Caller: Marsha/Mother; PCP: Marga Melnick; CB#: [phone]; Call regarding Cough/Congestion; Onset: 05/06/11. Cough, congestion, sore throat, sinus drainage. Pt was seen on 05/06/11. (+) Sinus Infection. Antibiotic and nasal spray. Cough has developed. Afebrile. Green mucous. Per Cough - Adult Protocol, advised to see MD within 24 hours. Care advice given. Protocol(s) Used: Cough - Adult Recommended Outcome per Protocol: See Provider within 24 hours Reason for Outcome: Productive cough with colored sputum (other than clear or white sputum)

## 2011-05-14 NOTE — Telephone Encounter (Signed)
Left message to call office

## 2011-05-31 ENCOUNTER — Encounter: Payer: Self-pay | Admitting: *Deleted

## 2011-05-31 NOTE — Telephone Encounter (Signed)
Left message to call office, [place] after several attempts to contact Pt with no response.

## 2011-11-23 ENCOUNTER — Encounter: Payer: Self-pay | Admitting: Internal Medicine

## 2011-11-23 ENCOUNTER — Ambulatory Visit (INDEPENDENT_AMBULATORY_CARE_PROVIDER_SITE_OTHER): Payer: BC Managed Care – PPO | Admitting: Internal Medicine

## 2011-11-23 VITALS — BP 122/76 | HR 79 | Temp 98.2°F | Wt 151.8 lb

## 2011-11-23 DIAGNOSIS — Z8249 Family history of ischemic heart disease and other diseases of the circulatory system: Secondary | ICD-10-CM

## 2011-11-23 DIAGNOSIS — R079 Chest pain, unspecified: Secondary | ICD-10-CM

## 2011-11-23 DIAGNOSIS — R0602 Shortness of breath: Secondary | ICD-10-CM

## 2011-11-23 DIAGNOSIS — R5383 Other fatigue: Secondary | ICD-10-CM

## 2011-11-23 LAB — TROPONIN I: Troponin I: <0.01 ng/mL (ref <0.06)

## 2011-11-23 LAB — CBC WITH DIFFERENTIAL/PLATELET
Basophils Absolute: 0 10*3/uL (ref 0.0–0.1)
Lymphocytes Relative: 26 % (ref 12–46)
Neutro Abs: 3.5 10*3/uL (ref 1.7–7.7)
Neutrophils Relative %: 65 % (ref 43–77)
Platelets: 238 10*3/uL (ref 150–400)
RBC: 4.74 MIL/uL (ref 4.22–5.81)
RDW: 12.8 % (ref 11.5–15.5)
WBC: 5.5 10*3/uL (ref 4.0–10.5)

## 2011-11-23 LAB — CK TOTAL AND CKMB (NOT AT ARMC): Relative Index: 0.9 (ref 0.0–2.5)

## 2011-11-23 NOTE — Patient Instructions (Addendum)
To ER if pain persists or progresses.  The triggers for reflux  include stress; the "aspirin family" ; alcohol; peppermint; and caffeine (coffee, tea, cola, and chocolate). The aspirin family would include aspirin and the nonsteroidal agents such as ibuprofen &  Naproxen. Tylenol would not cause reflux. If having symptoms ; food & drink should be avoided for @ least 2 hours before going to bed. If the dysphagia recurs; GI evaluation is indicated. Once the testing has been evaluated;Consider glucosamine sulfate 1500 mg daily for upper chest wall symptoms. Take this daily  for 3 months and then leave it off for 2 months. This will rehydrate the cartilages. Use an anti-inflammatory cream such as Aspercreme or Zostrix cream twice a day to thearea as needed. In lieu of this warm moist compresses or  hot water bottle can be used. Do not apply ice .   If you activate My Chart; the results can be released to you as soon as they populate from the lab. If you choose not to use this program; the labs have to be reviewed, copied & mailed   causing a delay in getting the results to you.

## 2011-11-23 NOTE — Progress Notes (Signed)
Subjective:    Patient ID: Tyler Zhang, male    DOB: [birth year], 21 y.o.   MRN: [id number]  HPI He experienced the acute onset of sharp left chest pain with associated  shortness of breath approximately 2 PM today while weed eating in 75-80 heat. The task did not involve repeated  waist flexion or squatting. He had been sweating prior to the onset of chest pain. Profound fatigue appeared after the shortness of breath. The pain has lasted over 90 minutes; it has dissipated slightly. He did not treat the pain with any medications.  Cardiology workup reviewed; stress echo was negative. Mention was made of possible cardiac CT.  Family history includes Wolff-Parkinson-White syndrome and his mother and myocardial infarction in his paternal grandfather and 3 paternal great uncles in their 45s.    Review of Systems Nausea/vomiting:nausea w/o vomiting Pleuritic:no Cough: no  Edema: no PND:no  Dizziness:some Palpitations:slight Syncope: no  Indigestion/dysphagia/abd pain/melena/rectal bleeding:no except single episode of  dysphagia 1 month ago ; PPI stopped 6 mos ago Recent Immobility:no Tearing/radiation to back: no  He is also had an unrelated costochondral type symptoms of the upper chest with associated popping and tenderness to palpation       Objective:   Physical Exam Gen.: Thin but healthy and well-nourished in appearance. Alert, appropriate and cooperative throughout exam. Eyes: No corneal or conjunctival inflammation noted. No lid lag or proptosis. Extraocular motion intact. Vision grossly normal. Mouth: Oral mucosa and oropharynx reveal no lesions or exudates. Teeth in good repair. Neck: No deformities, masses, or tenderness noted.  Thyroid normal. Lungs: Normal respiratory effort; chest expands symmetrically. Lungs are clear to auscultation without rales, wheezes, or increased work of breathing. Heart: Normal rate and rhythm. Normal S1 and S2. No gallop, click, or rub. S4  w/o murmur. Abdomen: Bowel sounds normal; abdomen soft and nontender. No masses, organomegaly or hernias noted.                                                           Musculoskeletal/extremities: No deformity or scoliosis noted of  the thoracic or lumbar spine. No clubbing, cyanosis, edema, or deformity noted. Tone & strength  normal.Joints normal. Nail health  good. Vascular: Carotid, radial artery, dorsalis pedis and  posterior tibial pulses are full and equal. No bruits present.Homan's negative Neurologic: Alert and oriented x3. Deep tendon reflexes symmetrical and normal.          Skin: Intact without suspicious lesions or rashes.Multiple tatooes Lymph: No cervical, axillary lymphadenopathy present. Psych: Mood and affect are normal. Normally interactive                                                                                         Assessment & Plan:  #1 atypical exertional chest pain associated with dyspnea and profound fatigue. Strong family history of premature coronary disease among paternal relatives. Because of the exertional context and family history; additional CT imaging as per his  Cardiologist may need to be considered.   #2 reflux; essentially asymptomatic at this time  Plan: See orders and recommendations

## 2011-11-24 ENCOUNTER — Telehealth: Payer: Self-pay | Admitting: *Deleted

## 2011-11-24 LAB — BASIC METABOLIC PANEL
Chloride: 106 mEq/L (ref 96–112)
Creat: 1.17 mg/dL (ref 0.50–1.35)
Potassium: 4.5 mEq/L (ref 3.5–5.3)
Sodium: 142 mEq/L (ref 135–145)

## 2011-11-24 LAB — TSH: TSH: 1.217 u[IU]/mL (ref 0.350–4.500)

## 2011-11-24 NOTE — Telephone Encounter (Signed)
[address] [place], [place] [address] p. [phone] f. [phone] To: [facility] Fax: [phone] From: [facility] Date/ Time: 11/23/2011 8:06 PM Taken By: Catheryn Bacon, CSR Caller: Delaney Meigs Facility: [facility] Patient: Tyler Zhang, Tyler Zhang DOB: 03/16/1991 Phone: [phone] Reason for Call: Delaney Meigs is calling from [place] regarding a CKMB, Troponin I, D Dimer ordered on Lysle Pearl by Chrissie Noa Hopper8/20/2013 4:57:00 PM. The results were Normal. Regarding Appointment: Appt Date: Appt Time: Unknown Provider: Reason: Details: Outcome:

## 2011-11-29 ENCOUNTER — Ambulatory Visit: Payer: BC Managed Care – PPO | Admitting: Internal Medicine

## 2011-11-30 ENCOUNTER — Ambulatory Visit (INDEPENDENT_AMBULATORY_CARE_PROVIDER_SITE_OTHER): Payer: BC Managed Care – PPO | Admitting: Internal Medicine

## 2011-11-30 ENCOUNTER — Encounter: Payer: Self-pay | Admitting: Internal Medicine

## 2011-11-30 ENCOUNTER — Ambulatory Visit (INDEPENDENT_AMBULATORY_CARE_PROVIDER_SITE_OTHER)
Admission: RE | Admit: 2011-11-30 | Discharge: 2011-11-30 | Disposition: A | Discharge status: Home / Self Care | Payer: BC Managed Care – PPO | Source: Ambulatory Visit | Attending: Internal Medicine | Admitting: Internal Medicine

## 2011-11-30 VITALS — BP 120/78 | HR 77 | Temp 98.1°F | Wt 152.0 lb

## 2011-11-30 DIAGNOSIS — S20219A Contusion of unspecified front wall of thorax, initial encounter: Secondary | ICD-10-CM

## 2011-11-30 NOTE — Assessment & Plan Note (Addendum)
Presents today is after a fall, has a chest wall contusion, he is worried about a fracture. Plan: Pain control with Tylenol and Motrin, and get the x-ray. Avoid risky situations!  Additionally, he was seen few days ago with chest pain, reports that this is a completely different type of pain, the pain he was evaluated for in the past is gone, he's not interested in pursuing labs as prescribed by his PCP on 11-23-2011

## 2011-11-30 NOTE — Progress Notes (Signed)
  Subjective:    Patient ID: Tyler Zhang, male    DOB: 10-28-1990, 21 y.o.   MRN: [id number]  HPI [place] 2 [place] 30 [place], landed on the water on his right side. Denies loss of consciousness, immediately after he developed pain in the right side of his face and right anterior chest. Face pain is going away, still has soreness to touch and deep breaths @ the chest  Past Medical History  Diagnosis Date  . Dyslipidemia     Lp(a) 32( normal < 20, borderline risk 20-300  . Hyperuricemia     uric acid 9.1  . Migraines   . ADD (attention deficit disorder)    Past Surgical History  Procedure Date  . Wisdom tooth extraction       Review of Systems Denies neck pain, back pain. No abdominal pain, gross hematuria or change in the color of the urine No shortness of breath.     Objective:   Physical Exam  Constitutional: He is oriented to person, place, and time. He appears well-developed and well-nourished. No distress.  Cardiovascular: Normal rate, regular rhythm and normal heart sounds.   No murmur heard. Pulmonary/Chest: Effort normal and breath sounds normal. No respiratory distress. He has no wheezes. He has no rales.  Abdominal: Soft. Bowel sounds are normal. He exhibits no distension. There is no tenderness. There is no rebound and no guarding.  Musculoskeletal: He exhibits no edema.       Arms: Neurological: He is alert and oriented to person, place, and time.  Skin: He is not diaphoretic.  Psychiatric: He has a normal mood and affect. His behavior is normal. Judgment and thought content normal.       Assessment & Plan:

## 2011-11-30 NOTE — Patient Instructions (Addendum)
Please get your x-ray at the other [place]  office located at: [address] [place], across from [facility].  Please go to the basement, this is a walk-in facility, they are open from 8:30 to 5:30 PM. Phone number [phone]. ---- Tylenol  500 mg OTC 2 tabs a day every 8 hours as needed for pain Ibuprofen (Motrin) 200 mg OTC 2 or 3 tabs every 8 hours as need for pain . This medicine can cause gastritis-ulcers in the stomach, if you develop nausea, stomach pain, change in the color of stools : stop ibuprofen and call us  ---- Call if no better in few days

## 2012-05-20 ENCOUNTER — Other Ambulatory Visit: Payer: Self-pay

## 2013-01-12 ENCOUNTER — Ambulatory Visit (HOSPITAL_BASED_OUTPATIENT_CLINIC_OR_DEPARTMENT_OTHER)
Admission: RE | Admit: 2013-01-12 | Discharge: 2013-01-12 | Disposition: A | Discharge status: Home / Self Care | Payer: BC Managed Care – PPO | Source: Ambulatory Visit | Attending: Family Medicine | Admitting: Family Medicine

## 2013-01-12 ENCOUNTER — Ambulatory Visit (INDEPENDENT_AMBULATORY_CARE_PROVIDER_SITE_OTHER): Payer: BC Managed Care – PPO | Admitting: Family Medicine

## 2013-01-12 ENCOUNTER — Encounter: Payer: Self-pay | Admitting: Family Medicine

## 2013-01-12 VITALS — BP 104/70 | HR 77 | Temp 98.0°F | Wt 162.4 lb

## 2013-01-12 DIAGNOSIS — M4120 Other idiopathic scoliosis, site unspecified: Secondary | ICD-10-CM | POA: Insufficient documentation

## 2013-01-12 DIAGNOSIS — M549 Dorsalgia, unspecified: Secondary | ICD-10-CM

## 2013-01-12 DIAGNOSIS — M545 Low back pain, unspecified: Secondary | ICD-10-CM | POA: Insufficient documentation

## 2013-01-12 MED ORDER — PREDNISONE 10 MG PO TABS: ORAL_TABLET | ORAL | Status: DC

## 2013-01-12 MED ORDER — HYDROCODONE-ACETAMINOPHEN 5-300 MG PO TABS: ORAL_TABLET | ORAL | Status: DC

## 2013-01-12 MED ORDER — METHYLPREDNISOLONE ACETATE 80 MG/ML IJ SUSP
80.0000 mg | Freq: Once | INTRAMUSCULAR | Status: AC
  Administered 2013-01-12: 80 mg via INTRAMUSCULAR

## 2013-01-12 MED ORDER — CYCLOBENZAPRINE HCL 10 MG PO TABS: 10.0000 mg | ORAL_TABLET | Freq: Three times a day (TID) | ORAL | Status: DC | PRN

## 2013-01-12 NOTE — Patient Instructions (Signed)
Back Pain, Adult  Low back pain is very common. About 1 in 5 people have back pain. The cause of low back pain is rarely dangerous. The pain often gets better over time. About half of people with a sudden onset of back pain feel better in just 2 weeks. About 8 in 10 people feel better by 6 weeks.   CAUSES  Some common causes of back pain include:  · Strain of the muscles or ligaments supporting the spine.  · Wear and tear (degeneration) of the spinal discs.  · Arthritis.  · Direct injury to the back.  DIAGNOSIS  Most of the time, the direct cause of low back pain is not known. However, back pain can be treated effectively even when the exact cause of the pain is unknown. Answering your caregiver's questions about your overall health and symptoms is one of the most accurate ways to make sure the cause of your pain is not dangerous. If your caregiver needs more information, he or she may order lab work or imaging tests (X-rays or MRIs). However, even if imaging tests show changes in your back, this usually does not require surgery.  HOME CARE INSTRUCTIONS  For many people, back pain returns. Since low back pain is rarely dangerous, it is often a condition that people can learn to manage on their own.   · Remain active. It is stressful on the back to sit or stand in one place. Do not sit, drive, or stand in one place for more than 30 minutes at a time. Take short walks on level surfaces as soon as pain allows. Try to increase the length of time you walk each day.  · Do not stay in bed. Resting more than 1 or 2 days can delay your recovery.  · Do not avoid exercise or work. Your body is made to move. It is not dangerous to be active, even though your back may hurt. Your back will likely heal faster if you return to being active before your pain is gone.  · Pay attention to your body when you  bend and lift. Many people have less discomfort when lifting if they bend their knees, keep the load close to their bodies, and  avoid twisting. Often, the most comfortable positions are those that put less stress on your recovering back.  · Find a comfortable position to sleep. Use a firm mattress and lie on your side with your knees slightly bent. If you lie on your back, put a pillow under your knees.  · Only take over-the-counter or prescription medicines as directed by your caregiver. Over-the-counter medicines to reduce pain and inflammation are often the most helpful. Your caregiver may prescribe muscle relaxant drugs. These medicines help dull your pain so you can more quickly return to your normal activities and healthy exercise.  · Put ice on the injured area.  · Put ice in a plastic bag.  · Place a towel between your skin and the bag.  · Leave the ice on for 15-20 minutes, 3-4 times a day for the first 2 to 3 days. After that, ice and heat may be alternated to reduce pain and spasms.  · Ask your caregiver about trying back exercises and gentle massage. This may be of some benefit.  · Avoid feeling anxious or stressed. Stress increases muscle tension and can worsen back pain. It is important to recognize when you are anxious or stressed and learn ways to manage it. Exercise is a great option.  SEEK MEDICAL CARE IF:  · You have pain that is not relieved with rest or   medicine.  · You have pain that does not improve in 1 week.  · You have new symptoms.  · You are generally not feeling well.  SEEK IMMEDIATE MEDICAL CARE IF:   · You have pain that radiates from your back into your legs.  · You develop new bowel or bladder control problems.  · You have unusual weakness or numbness in your arms or legs.  · You develop nausea or vomiting.  · You develop abdominal pain.  · You feel faint.  Document Released: 03/22/2005 Document Revised: 09/21/2011 Document Reviewed: 08/10/2010  [employer]® Patient Information ©2014 [employer].
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
## 2013-02-08 ENCOUNTER — Other Ambulatory Visit: Payer: Self-pay

## 2013-04-23 ENCOUNTER — Ambulatory Visit (INDEPENDENT_AMBULATORY_CARE_PROVIDER_SITE_OTHER): Payer: BC Managed Care – PPO | Admitting: Internal Medicine

## 2013-04-23 ENCOUNTER — Encounter: Payer: Self-pay | Admitting: Internal Medicine

## 2013-04-23 VITALS — BP 99/60 | HR 71 | Temp 98.2°F | Wt 182.2 lb

## 2013-04-23 DIAGNOSIS — I781 Nevus, non-neoplastic: Secondary | ICD-10-CM

## 2013-04-23 NOTE — Patient Instructions (Signed)
Use  Aveeno Daily  Moisturizing Lotion  twice a day to the lesion . Bathe with moisturizing liquid soap , not bar soap.

## 2013-04-23 NOTE — Progress Notes (Signed)
   Subjective:    Patient ID: Tyler Zhang, male    DOB: [birth year], 23 y.o.   MRN: [id number]  HPI    He noted a "mole" over the right biceps next to a vein 04/19/13. He did pick it off of his nail resulting in some bleeding  He has used a tanning bed on about 5 occasions for 5 minutes a time. He's had similar lesions in the past  There is no personal history of basal cell, squamous cell, or melanoma.His MGF had skin cancer      Review of Systems  He has no fever, chills, or sweats. He has no unexplained weight loss     Objective:   Physical Exam  He is very well conditioned and healthy appearing  He is in no distress  He has no lymphadenopathy about the neck, supraclavicular area, or axillary areas.  He has no organomegaly or masses  There is a 2 mm erythematous lesion over the right biceps which does not blanch with pressure.        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

## 2013-04-23 NOTE — Progress Notes (Signed)
Pre visit review using our clinic review tool, if applicable. No additional management support is needed unless otherwise documented below in the visit note. 

## 2013-06-13 ENCOUNTER — Ambulatory Visit (INDEPENDENT_AMBULATORY_CARE_PROVIDER_SITE_OTHER): Payer: BC Managed Care – PPO | Admitting: Internal Medicine

## 2013-06-13 ENCOUNTER — Encounter: Payer: Self-pay | Admitting: Internal Medicine

## 2013-06-13 VITALS — BP 90/60 | HR 80 | Temp 97.6°F | Resp 12 | Wt 181.0 lb

## 2013-06-13 DIAGNOSIS — F988 Other specified behavioral and emotional disorders with onset usually occurring in childhood and adolescence: Secondary | ICD-10-CM

## 2013-06-13 MED ORDER — AMPHETAMINE-DEXTROAMPHET ER 20 MG PO CP24: 20.0000 mg | ORAL_CAPSULE | ORAL | Status: AC

## 2013-06-13 MED ORDER — AMPHETAMINE-DEXTROAMPHETAMINE 10 MG PO TABS: ORAL_TABLET | ORAL | Status: AC

## 2013-06-13 NOTE — Assessment & Plan Note (Signed)
06/13/13 renew Adderal

## 2013-06-13 NOTE — Progress Notes (Signed)
   Subjective:    Patient ID: Tyler Zhang, male    DOB: 27-Oct-1990, 23 y.o.   MRN: [id number]  HPI The patient reports that his symptoms began in elementary school. Symptoms include difficulty focusing  with no behavioral issues. He does notice that he has frequent episodes of reduced productivity at work due to lack of focus. He has been officially tested for ADD in 2009. He has had previous trials of Vyvanse and Adderall. The Vyvanse, while effective, caused insomnia and dehydration. The Adderall did not cause any adverse effects.           Review of Systems He has had no significant weight gain / loss,  sleep disruption, fatigue, or  weakness. He denies heart racing, skipping or abnormal sweating. There is no nausea or vomiting, loss of appetite,constipation, or diarrhea: He does not haveTremor, mental status change or headache. He denies anxiety, depression, loss of interest, panic attacks, or alcohol use: There are no change in skin/hair/nails.      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  Psych: Normally communicative and interactive; no abnormal mood or affect clinically.         Assessment & Plan:  See Current Assessment & Plan in Problem List under specific Diagnosis

## 2013-06-13 NOTE — Progress Notes (Signed)
Pre visit review using our clinic review tool, if applicable. No additional management support is needed unless otherwise documented below in the visit note. 

## 2013-06-13 NOTE — Patient Instructions (Addendum)
If the second dose of low-dose Adderall is needed in the afternoon; do not take within 4-6 hours of going to bed.

## 2013-06-20 ENCOUNTER — Telehealth: Payer: Self-pay

## 2013-06-20 NOTE — Telephone Encounter (Signed)
The patient called and stated [facility] hasn't refilled his adderrall rx due to a prior auth problem.    Callback - [phone]

## 2013-06-26 NOTE — Telephone Encounter (Signed)
Spoke with the pt and informed him that the prior auth was done and his meds were approved.  Pt understood and agreed.//AB/CMA

## 2014-01-14 ENCOUNTER — Encounter: Payer: Self-pay | Admitting: Cardiovascular Disease
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
## 2014-11-18 ENCOUNTER — Telehealth: Payer: Self-pay | Admitting: Internal Medicine
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
## 2014-11-22 ENCOUNTER — Ambulatory Visit: Payer: Self-pay | Admitting: Family

## 2017-12-22 ENCOUNTER — Encounter (HOSPITAL_BASED_OUTPATIENT_CLINIC_OR_DEPARTMENT_OTHER): Payer: Self-pay | Admitting: Emergency Medicine

## 2017-12-22 ENCOUNTER — Other Ambulatory Visit: Payer: Self-pay

## 2017-12-22 ENCOUNTER — Emergency Department (HOSPITAL_BASED_OUTPATIENT_CLINIC_OR_DEPARTMENT_OTHER)
Admission: EM | Admit: 2017-12-22 | Discharge: 2017-12-22 | Disposition: A | Discharge status: Home / Self Care | Payer: BC Managed Care – PPO | Attending: Emergency Medicine | Admitting: Emergency Medicine

## 2017-12-22 DIAGNOSIS — Z79899 Other long term (current) drug therapy: Secondary | ICD-10-CM | POA: Insufficient documentation

## 2017-12-22 DIAGNOSIS — B9789 Other viral agents as the cause of diseases classified elsewhere: Secondary | ICD-10-CM | POA: Insufficient documentation

## 2017-12-22 DIAGNOSIS — G43409 Hemiplegic migraine, not intractable, without status migrainosus: Secondary | ICD-10-CM | POA: Insufficient documentation

## 2017-12-22 DIAGNOSIS — J988 Other specified respiratory disorders: Secondary | ICD-10-CM | POA: Insufficient documentation

## 2017-12-22 LAB — COMPREHENSIVE METABOLIC PANEL WITH GFR
ALT: 36 U/L (ref 0–44)
AST: 37 U/L (ref 15–41)
Albumin: 4.1 g/dL (ref 3.5–5.0)
Alkaline Phosphatase: 51 U/L (ref 38–126)
Anion gap: 9 (ref 5–15)
BUN: 10 mg/dL (ref 6–20)
CO2: 28 mmol/L (ref 22–32)
Calcium: 8.7 mg/dL — ABNORMAL LOW (ref 8.9–10.3)
Chloride: 100 mmol/L (ref 98–111)
Creatinine, Ser: 1.39 mg/dL — ABNORMAL HIGH (ref 0.61–1.24)
GFR calc Af Amer: >60 mL/min
GFR calc non Af Amer: >60 mL/min
Glucose, Bld: 90 mg/dL (ref 70–99)
Potassium: 4 mmol/L (ref 3.5–5.1)
Sodium: 137 mmol/L (ref 135–145)
Total Bilirubin: 1.1 mg/dL (ref 0.3–1.2)
Total Protein: 7.5 g/dL (ref 6.5–8.1)

## 2017-12-22 LAB — CBC WITH DIFFERENTIAL/PLATELET
Basophils Absolute: 0 K/uL (ref 0.0–0.1)
Basophils Relative: 0 %
Eosinophils Absolute: 0 K/uL (ref 0.0–0.7)
Eosinophils Relative: 0 %
HCT: 43.1 % (ref 39.0–52.0)
Hemoglobin: 14.7 g/dL (ref 13.0–17.0)
Lymphocytes Relative: 6 %
Lymphs Abs: 0.8 K/uL (ref 0.7–4.0)
MCH: 31.2 pg (ref 26.0–34.0)
MCHC: 34.1 g/dL (ref 30.0–36.0)
MCV: 91.5 fL (ref 78.0–100.0)
Monocytes Absolute: 1.9 K/uL — ABNORMAL HIGH (ref 0.1–1.0)
Monocytes Relative: 14 %
Neutro Abs: 10.7 K/uL — ABNORMAL HIGH (ref 1.7–7.7)
Neutrophils Relative %: 80 %
Platelets: 186 K/uL (ref 150–400)
RBC: 4.71 MIL/uL (ref 4.22–5.81)
RDW: 13.5 % (ref 11.5–15.5)
WBC: 13.4 K/uL — ABNORMAL HIGH (ref 4.0–10.5)

## 2017-12-22 LAB — GROUP A STREP BY PCR: Group A Strep by PCR: NOT DETECTED

## 2017-12-22 MED ORDER — SODIUM CHLORIDE 0.9 % IV BOLUS
1000.0000 mL | Freq: Once | INTRAVENOUS | Status: AC
  Administered 2017-12-22: 1000 mL via INTRAVENOUS

## 2017-12-22 MED ORDER — ONDANSETRON HCL 4 MG/2ML IJ SOLN
4.0000 mg | Freq: Once | INTRAMUSCULAR | Status: AC
  Administered 2017-12-22: 4 mg via INTRAVENOUS
  Filled 2017-12-22: qty 2

## 2017-12-22 MED ORDER — METOCLOPRAMIDE HCL 5 MG/ML IJ SOLN
10.0000 mg | Freq: Once | INTRAMUSCULAR | Status: AC
  Administered 2017-12-22: 10 mg via INTRAVENOUS
  Filled 2017-12-22: qty 2

## 2017-12-22 MED ORDER — ACETAMINOPHEN 500 MG PO TABS
1000.0000 mg | ORAL_TABLET | Freq: Once | ORAL | Status: AC
  Administered 2017-12-22: 1000 mg via ORAL
  Filled 2017-12-22: qty 2

## 2017-12-22 MED ORDER — IBUPROFEN 800 MG PO TABS
800.0000 mg | ORAL_TABLET | Freq: Once | ORAL | Status: AC
  Administered 2017-12-22: 800 mg via ORAL
  Filled 2017-12-22: qty 1

## 2017-12-22 MED ORDER — ONDANSETRON 4 MG PO TBDP: 4.0000 mg | ORAL_TABLET | Freq: Once | ORAL | Status: DC

## 2017-12-22 NOTE — ED Notes (Signed)
Pt actively vomiting in triage 

## 2017-12-22 NOTE — ED Triage Notes (Signed)
Pt reports headache, sore throat, congestion and fever x 2 days. No meds for fever x 6 hours.

## 2017-12-22 NOTE — ED Provider Notes (Signed)
[facility] Provider Note: Lowella DellJ. Lane Keynan Heffern, MD, FACEP  CSN: [id number] MRN: [id number] ARRIVAL: 12/22/17 at 0018 ROOM: [facility]09/[facility]09   CHIEF COMPLAINT  URI   HISTORY OF PRESENT ILLNESS  12/22/17 2:35 AM Tyler Zhang is a 27 y.o. male with a history of migraines.  He is here with a 3-day history of nasal congestion, clear rhinorrhea, malaise and sore throat.  Yesterday he began having fevers which she has been treating with ibuprofen and acetaminophen.  His fever peaked at 102.7 yesterday evening and he developed a migraine.  He describes the migraine as severe and associated with nausea and vomiting as well as photophobia.  He was treated with ibuprofen, Zofran and a fluid bolus per protocol with partial improvement but he is still having a headache and photophobia.  He had mild diarrhea early in the course of this illness which is resolved.  He denies abdominal pain or shortness of breath.  He has had an occasional cough.   Past Medical History:  Diagnosis Date  . ADD (attention deficit disorder)   . Dyslipidemia    Lp(a) 32( normal < 20, borderline risk 20-300  . Hyperuricemia    uric acid 9.1  . Migraines     Past Surgical History:  Procedure Laterality Date  . WISDOM TOOTH EXTRACTION      Family History  Problem Relation Age of Onset  . Hypertension Father   . Hyperlipidemia Father   . Migraines Mother   . Evelene CroonWolff Parkinson White syndrome Mother   . Migraines Brother   . Heart attack Paternal Grandfather [id number]  . Lupus Maternal Aunt   . Diabetes Maternal Uncle   . Hypertension Paternal Uncle   . Heart attack Unknown        3 PG uncles in their 30s    Social History   Tobacco Use  . Smoking status: Never Smoker  Substance Use Topics  . Alcohol use: Yes    Comment:  rarely, 1-2 X/ month  . Drug use: No    Prior to Admission medications   Medication Sig Start Date End Date Taking? Authorizing Provider  amphetamine-dextroamphetamine (ADDERALL XR) 20  MG 24 hr capsule Take 1 capsule (20 mg total) by mouth every morning. 06/13/13   Pecola LawlessHopper, William F, MD  amphetamine-dextroamphetamine (ADDERALL) 10 MG tablet 1 qd in afternoon as needed 06/13/13   Pecola LawlessHopper, William F, MD    Allergies Patient has no known allergies.   REVIEW OF SYSTEMS  Negative except as noted here or in the History of Present Illness.   PHYSICAL EXAMINATION  Initial Vital Signs Blood pressure (!) 98/52, pulse 94, temperature 99.5 F (37.5 C), temperature source Oral, resp. rate 18, height 6' (1.829 m), weight 86.2 kg, SpO2 97 %.  Examination General: Well-developed, well-nourished male in no acute distress; appearance consistent with age of record HENT: normocephalic; atraumatic; nasal congestion; no pharyngeal erythema or exudate Eyes: pupils equal, round and reactive to light; extraocular muscles intact; photophobia Neck: supple; no lymphadenopathy Heart: regular rate and rhythm Lungs: clear to auscultation bilaterally Abdomen: soft; nondistended; nontender; bowel sounds present Extremities: No deformity; full range of motion; pulses normal Neurologic: Awake, alert and oriented; motor function intact in all extremities and symmetric; no facial droop Skin: Warm and dry Psychiatric: Normal mood and affect   RESULTS  Summary of this visit's results, reviewed by myself:   EKG Interpretation  Date/Time:    Ventricular Rate:    PR Interval:    QRS Duration:  QT Interval:    QTC Calculation:   R Axis:     Text Interpretation:        Laboratory Studies: Results for orders placed or performed during the hospital encounter of 12/22/17 (from the past 24 hour(s))  Group A Strep by PCR     Status: None   Collection Time: 12/22/17 12:41 AM  Result Value Ref Range   Group A Strep by PCR NOT DETECTED NOT DETECTED  CBC with Differential     Status: Abnormal   Collection Time: 12/22/17 12:41 AM  Result Value Ref Range   WBC 13.4 (H) 4.0 - 10.5 K/uL   RBC 4.71  4.22 - 5.81 MIL/uL   Hemoglobin 14.7 13.0 - 17.0 g/dL   HCT 16.1 09.6 - 04.5 %   MCV 91.5 78.0 - 100.0 fL   MCH 31.2 26.0 - 34.0 pg   MCHC 34.1 30.0 - 36.0 g/dL   RDW 40.9 81.1 - 91.4 %   Platelets 186 150 - 400 K/uL   Neutrophils Relative % 80 %   Neutro Abs 10.7 (H) 1.7 - 7.7 K/uL   Lymphocytes Relative 6 %   Lymphs Abs 0.8 0.7 - 4.0 K/uL   Monocytes Relative 14 %   Monocytes Absolute 1.9 (H) 0.1 - 1.0 K/uL   Eosinophils Relative 0 %   Eosinophils Absolute 0.0 0.0 - 0.7 K/uL   Basophils Relative 0 %   Basophils Absolute 0.0 0.0 - 0.1 K/uL  Comprehensive metabolic panel     Status: Abnormal   Collection Time: 12/22/17 12:41 AM  Result Value Ref Range   Sodium 137 135 - 145 mmol/L   Potassium 4.0 3.5 - 5.1 mmol/L   Chloride 100 98 - 111 mmol/L   CO2 28 22 - 32 mmol/L   Glucose, Bld 90 70 - 99 mg/dL   BUN 10 6 - 20 mg/dL   Creatinine, Ser 7.82 (H) 0.61 - 1.24 mg/dL   Calcium 8.7 (L) 8.9 - 10.3 mg/dL   Total Protein 7.5 6.5 - 8.1 g/dL   Albumin 4.1 3.5 - 5.0 g/dL   AST 37 15 - 41 U/L   ALT 36 0 - 44 U/L   Alkaline Phosphatase 51 38 - 126 U/L   Total Bilirubin 1.1 0.3 - 1.2 mg/dL   GFR calc non Af Amer >60 >60 mL/min   GFR calc Af Amer >60 >60 mL/min   Anion gap 9 5 - 15   Imaging Studies: No results found.  ED COURSE and MDM  Nursing notes and initial vitals signs, including pulse oximetry, reviewed.  Vitals:   12/22/17 0028 12/22/17 0152  BP: 105/62 (!) 98/52  Pulse: (!) 101 94  Resp: 18 18  Temp: (!) 101.4 F (38.6 C) 99.5 F (37.5 C)  TempSrc: Oral Oral  SpO2: 100% 97%  Weight: 86.2 kg   Height: 6' (1.829 m)    3:20 AM Headache improved after IV Reglan.  Patient states he is feeling well enough to go home.  His acute febrile illness is likely a viral respiratory illness.  PROCEDURES    ED DIAGNOSES     ICD-10-CM   1. Viral respiratory illness J98.8    B97.89   2. Sporadic migraine G43.409        Edenilson Austad, MD 12/22/17 [phone]

## 2022-03-18 DIAGNOSIS — B349 Viral infection, unspecified: Secondary | ICD-10-CM | POA: Diagnosis not present

## 2022-03-18 DIAGNOSIS — R519 Headache, unspecified: Secondary | ICD-10-CM | POA: Diagnosis not present

## 2022-03-18 DIAGNOSIS — R509 Fever, unspecified: Secondary | ICD-10-CM | POA: Diagnosis not present

## 2022-03-18 DIAGNOSIS — R051 Acute cough: Secondary | ICD-10-CM | POA: Diagnosis not present

## 2022-03-20 ENCOUNTER — Emergency Department (HOSPITAL_COMMUNITY)
Admission: EM | Admit: 2022-03-20 | Discharge: 2022-03-20 | Discharge status: Against Medical Advice | Payer: BC Managed Care – PPO | Attending: Emergency Medicine | Admitting: Emergency Medicine

## 2022-03-20 ENCOUNTER — Other Ambulatory Visit: Payer: Self-pay

## 2022-03-20 ENCOUNTER — Emergency Department (HOSPITAL_COMMUNITY): Payer: BC Managed Care – PPO

## 2022-03-20 ENCOUNTER — Encounter (HOSPITAL_COMMUNITY): Payer: Self-pay

## 2022-03-20 DIAGNOSIS — R059 Cough, unspecified: Secondary | ICD-10-CM | POA: Diagnosis not present

## 2022-03-20 DIAGNOSIS — R519 Headache, unspecified: Secondary | ICD-10-CM | POA: Diagnosis not present

## 2022-03-20 DIAGNOSIS — Z1152 Encounter for screening for COVID-19: Secondary | ICD-10-CM | POA: Insufficient documentation

## 2022-03-20 DIAGNOSIS — R918 Other nonspecific abnormal finding of lung field: Secondary | ICD-10-CM | POA: Diagnosis not present

## 2022-03-20 DIAGNOSIS — Z5321 Procedure and treatment not carried out due to patient leaving prior to being seen by health care provider: Secondary | ICD-10-CM | POA: Diagnosis not present

## 2022-03-20 LAB — COMPREHENSIVE METABOLIC PANEL
ALT: 219 U/L — ABNORMAL HIGH (ref 0–44)
AST: 125 U/L — ABNORMAL HIGH (ref 15–41)
Albumin: 3.3 g/dL — ABNORMAL LOW (ref 3.5–5.0)
Alkaline Phosphatase: 106 U/L (ref 38–126)
Anion gap: 10 (ref 5–15)
BUN: 6 mg/dL (ref 6–20)
CO2: 25 mmol/L (ref 22–32)
Calcium: 8.8 mg/dL — ABNORMAL LOW (ref 8.9–10.3)
Chloride: 102 mmol/L (ref 98–111)
Creatinine, Ser: 1.04 mg/dL (ref 0.61–1.24)
GFR, Estimated: >60 mL/min (ref >60)
Glucose, Bld: 103 mg/dL — ABNORMAL HIGH (ref 70–99)
Potassium: 3.8 mmol/L (ref 3.5–5.1)
Sodium: 137 mmol/L (ref 135–145)
Total Bilirubin: 1.2 mg/dL (ref 0.3–1.2)
Total Protein: 6.4 g/dL — ABNORMAL LOW (ref 6.5–8.1)

## 2022-03-20 LAB — CBC WITH DIFFERENTIAL/PLATELET
Abs Immature Granulocytes: 0.02 10*3/uL (ref 0.00–0.07)
Basophils Absolute: 0 10*3/uL (ref 0.0–0.1)
Basophils Relative: 0 %
Eosinophils Absolute: 0.1 10*3/uL (ref 0.0–0.5)
Eosinophils Relative: 2 %
HCT: 39.7 % (ref 39.0–52.0)
Hemoglobin: 13.9 g/dL (ref 13.0–17.0)
Immature Granulocytes: 0 %
Lymphocytes Relative: 17 %
Lymphs Abs: 1.4 10*3/uL (ref 0.7–4.0)
MCH: 32 pg (ref 26.0–34.0)
MCHC: 35 g/dL (ref 30.0–36.0)
MCV: 91.5 fL (ref 80.0–100.0)
Monocytes Absolute: 1.1 10*3/uL — ABNORMAL HIGH (ref 0.1–1.0)
Monocytes Relative: 13 %
Neutro Abs: 5.5 10*3/uL (ref 1.7–7.7)
Neutrophils Relative %: 68 %
Platelets: 273 10*3/uL (ref 150–400)
RBC: 4.34 MIL/uL (ref 4.22–5.81)
RDW: 12.3 % (ref 11.5–15.5)
WBC: 8.1 10*3/uL (ref 4.0–10.5)
nRBC: 0 % (ref 0.0–0.2)

## 2022-03-20 LAB — RESP PANEL BY RT-PCR (RSV, FLU A&B, COVID)  RVPGX2
Influenza A by PCR: NEGATIVE
Influenza B by PCR: NEGATIVE
Resp Syncytial Virus by PCR: NEGATIVE
SARS Coronavirus 2 by RT PCR: NEGATIVE

## 2022-03-20 MED ORDER — ACETAMINOPHEN 325 MG PO TABS: 650.0000 mg | ORAL_TABLET | Freq: Once | ORAL | Status: DC

## 2022-03-20 MED ORDER — ONDANSETRON 4 MG PO TBDP: 8.0000 mg | ORAL_TABLET | Freq: Once | ORAL | Status: DC

## 2022-03-20 NOTE — ED Notes (Signed)
Patient called for registration w/ no answer. Dragged OTF.

## 2022-03-20 NOTE — ED Provider Triage Note (Signed)
Emergency Medicine Provider Triage Evaluation Note  Tyler Zhang , a 31 y.o. male  was evaluated in triage.  Pt complains of foreign body in left foot.  Patient states she was walking outside and felt like a piece of glass splinter in her foot.  She did not visualize object and went to foot.  She states she is trying to pull out object but has been unsuccessful.  Denies any known drainage from site.  States the pain was getting worse which prompted her visit to emergency department today..  Review of Systems  Positive: See above Negative:   Physical Exam  BP 118/68   Pulse 89   Temp (!) 100.5 F (38.1 C)   Resp 18   Ht 6' (1.829 m)   Wt 77.1 kg   SpO2 97%   BMI 23.06 kg/m  Gen:   Awake, no distress   Resp:  Normal effort  MSK:   Moves extremities without difficulty  Other:  Tender palpation left pad just proximal to fifth toe.  No surrounding erythema/palpable fluctuance.  Medical Decision Making  Medically screening exam initiated at 2:12 PM.  Appropriate orders placed.  Tyler Zhang was informed that the remainder of the evaluation will be completed by another provider, this initial triage assessment does not replace that evaluation, and the importance of remaining in the ED until their evaluation is complete.     Peter Garter, [place] 03/20/22 1427

## 2022-03-20 NOTE — ED Triage Notes (Signed)
Pt arrived POV from home c/o having the flu for the last 6-7 days. Pt was seen at urgent care but states he does not feel any better. Pt also states he has had a headache for 8 days.

## 2022-03-21 ENCOUNTER — Other Ambulatory Visit: Payer: Self-pay

## 2022-03-21 ENCOUNTER — Emergency Department (HOSPITAL_BASED_OUTPATIENT_CLINIC_OR_DEPARTMENT_OTHER)
Admission: EM | Admit: 2022-03-21 | Discharge: 2022-03-21 | Disposition: A | Discharge status: Home / Self Care | Payer: BC Managed Care – PPO | Attending: Emergency Medicine | Admitting: Emergency Medicine

## 2022-03-21 ENCOUNTER — Encounter (HOSPITAL_BASED_OUTPATIENT_CLINIC_OR_DEPARTMENT_OTHER): Payer: Self-pay | Admitting: Emergency Medicine

## 2022-03-21 DIAGNOSIS — R042 Hemoptysis: Secondary | ICD-10-CM | POA: Diagnosis not present

## 2022-03-21 DIAGNOSIS — G43809 Other migraine, not intractable, without status migrainosus: Secondary | ICD-10-CM | POA: Insufficient documentation

## 2022-03-21 DIAGNOSIS — J189 Pneumonia, unspecified organism: Secondary | ICD-10-CM | POA: Insufficient documentation

## 2022-03-21 LAB — CBC WITH DIFFERENTIAL/PLATELET
Abs Immature Granulocytes: 0.03 10*3/uL (ref 0.00–0.07)
Basophils Absolute: 0 10*3/uL (ref 0.0–0.1)
Basophils Relative: 0 %
Eosinophils Absolute: 0.2 10*3/uL (ref 0.0–0.5)
Eosinophils Relative: 2 %
HCT: 39.9 % (ref 39.0–52.0)
Hemoglobin: 13.6 g/dL (ref 13.0–17.0)
Immature Granulocytes: 0 %
Lymphocytes Relative: 16 %
Lymphs Abs: 1.4 10*3/uL (ref 0.7–4.0)
MCH: 31.1 pg (ref 26.0–34.0)
MCHC: 34.1 g/dL (ref 30.0–36.0)
MCV: 91.1 fL (ref 80.0–100.0)
Monocytes Absolute: 1 10*3/uL (ref 0.1–1.0)
Monocytes Relative: 12 %
Neutro Abs: 6.3 10*3/uL (ref 1.7–7.7)
Neutrophils Relative %: 70 %
Platelets: 355 10*3/uL (ref 150–400)
RBC: 4.38 MIL/uL (ref 4.22–5.81)
RDW: 12.3 % (ref 11.5–15.5)
WBC: 9 10*3/uL (ref 4.0–10.5)
nRBC: 0 % (ref 0.0–0.2)

## 2022-03-21 LAB — COMPREHENSIVE METABOLIC PANEL
ALT: 190 U/L — ABNORMAL HIGH (ref 0–44)
AST: 106 U/L — ABNORMAL HIGH (ref 15–41)
Albumin: 3.4 g/dL — ABNORMAL LOW (ref 3.5–5.0)
Alkaline Phosphatase: 114 U/L (ref 38–126)
Anion gap: 8 (ref 5–15)
BUN: 10 mg/dL (ref 6–20)
CO2: 29 mmol/L (ref 22–32)
Calcium: 8.9 mg/dL (ref 8.9–10.3)
Chloride: 100 mmol/L (ref 98–111)
Creatinine, Ser: 1 mg/dL (ref 0.61–1.24)
GFR, Estimated: >60 mL/min (ref >60)
Glucose, Bld: 113 mg/dL — ABNORMAL HIGH (ref 70–99)
Potassium: 4.2 mmol/L (ref 3.5–5.1)
Sodium: 137 mmol/L (ref 135–145)
Total Bilirubin: 0.9 mg/dL (ref 0.3–1.2)
Total Protein: 7.3 g/dL (ref 6.5–8.1)

## 2022-03-21 LAB — LIPASE, BLOOD: Lipase: 29 U/L (ref 11–51)

## 2022-03-21 MED ORDER — DOXYCYCLINE HYCLATE 100 MG PO TABS
100.0000 mg | ORAL_TABLET | Freq: Once | ORAL | Status: AC
  Administered 2022-03-21: 100 mg via ORAL
  Filled 2022-03-21: qty 1

## 2022-03-21 MED ORDER — DEXAMETHASONE SODIUM PHOSPHATE 10 MG/ML IJ SOLN
10.0000 mg | Freq: Once | INTRAMUSCULAR | Status: AC
  Administered 2022-03-21: 10 mg via INTRAVENOUS
  Filled 2022-03-21: qty 1

## 2022-03-21 MED ORDER — BENZONATATE 100 MG PO CAPS: 100.0000 mg | ORAL_CAPSULE | Freq: Three times a day (TID) | ORAL | 0 refills | Status: DC | PRN

## 2022-03-21 MED ORDER — MAGNESIUM SULFATE IN D5W 1-5 GM/100ML-% IV SOLN
1.0000 g | Freq: Once | INTRAVENOUS | Status: AC
  Administered 2022-03-21: 1 g via INTRAVENOUS
  Filled 2022-03-21: qty 100

## 2022-03-21 MED ORDER — DOXYCYCLINE HYCLATE 100 MG PO CAPS: 100.0000 mg | ORAL_CAPSULE | Freq: Two times a day (BID) | ORAL | 0 refills | Status: AC

## 2022-03-21 MED ORDER — ALBUTEROL SULFATE HFA 108 (90 BASE) MCG/ACT IN AERS: 1.0000 | INHALATION_SPRAY | Freq: Four times a day (QID) | RESPIRATORY_TRACT | 0 refills | Status: DC | PRN

## 2022-03-21 MED ORDER — ONDANSETRON HCL 4 MG/2ML IJ SOLN
4.0000 mg | Freq: Once | INTRAMUSCULAR | Status: AC
  Administered 2022-03-21: 4 mg via INTRAVENOUS
  Filled 2022-03-21: qty 2

## 2022-03-21 MED ORDER — SODIUM CHLORIDE 0.9 % IV BOLUS
1000.0000 mL | Freq: Once | INTRAVENOUS | Status: AC
  Administered 2022-03-21: 1000 mL via INTRAVENOUS

## 2022-03-21 MED ORDER — KETOROLAC TROMETHAMINE 30 MG/ML IJ SOLN
30.0000 mg | Freq: Once | INTRAMUSCULAR | Status: AC
  Administered 2022-03-21: 30 mg via INTRAVENOUS
  Filled 2022-03-21: qty 1

## 2022-03-21 NOTE — ED Provider Notes (Signed)
Emergency Department Provider Note   I have reviewed the triage vital signs and the nursing notes.   HISTORY  Chief Complaint Hemoptysis   HPI Tyler Zhang is a 31 y.o. male presents to the ED with > 1 week of flu-like symptoms and blood tinged sputum last night. Tyler Zhang had body aches as well. No active CP or SOB. Some CP without cough but not at rest. Also notes development of a migraine HA. No sudden onset/maximum intensity HA.    Past Medical History:  Diagnosis Date   ADD (attention deficit disorder)    Dyslipidemia    Lp(a) 32( normal < 20, borderline risk 20-300   Hyperuricemia    uric acid 9.1   Migraines     Review of Systems  Constitutional: Positive fever/chills Eyes: No visual changes. ENT: No sore throat. Positive congestion.  Cardiovascular: Denies chest pain. Respiratory: Denies shortness of breath. Positive cough.  Gastrointestinal: No abdominal pain.  No nausea. Positive vomiting.  No diarrhea.  No constipation. Genitourinary: Negative for dysuria. Musculoskeletal: Negative for back pain. Skin: Negative for rash. Neurological: Positive HA.   ____________________________________________   PHYSICAL EXAM:  VITAL SIGNS: ED Triage Vitals  Enc Vitals Group     BP 03/21/22 1304 119/70     Pulse Rate 03/21/22 1304 (!) 101     Resp 03/21/22 1304 20     Temp 03/21/22 1304 98.7 F (37.1 C)     Temp Source 03/21/22 1304 Oral     SpO2 03/21/22 1304 93 %     Weight 03/21/22 1304 167 lb (75.8 kg)     Height 03/21/22 1304 6' (1.829 m)   Constitutional: Alert and oriented. Well appearing and in no acute distress. Eyes: Conjunctivae are normal. Positive photophobia.  Head: Atraumatic. Ears:  Healthy appearing ear canals and TMs bilaterally Nose: Positive congestion/rhinnorhea. Mouth/Throat: Mucous membranes are moist.  Oropharynx non-erythematous. Neck: No stridor.   Cardiovascular: Normal rate, regular rhythm. Good peripheral circulation. Grossly  normal heart sounds.   Respiratory: Normal respiratory effort.  No retractions. Lungs CTAB. Gastrointestinal: No distention.  Musculoskeletal: No lower extremity tenderness nor edema. No gross deformities of extremities. Neurologic:  Normal speech and language. No gross focal neurologic deficits are appreciated.  Skin:  Skin is warm, dry and intact. No rash noted.  ____________________________________________   LABS (all labs ordered are listed, but only abnormal results are displayed)  Labs Reviewed  COMPREHENSIVE METABOLIC PANEL - Abnormal; Notable for the following components:      Result Value   Glucose, Bld 113 (*)    Albumin 3.4 (*)    AST 106 (*)    ALT 190 (*)    All other components within normal limits  CBC WITH DIFFERENTIAL/PLATELET  LIPASE, BLOOD   ____________________________________________  RADIOLOGY  DG Chest 2 View  Result Date: 03/20/2022 CLINICAL DATA:  Cough and flu-like symptoms for 1 week EXAM: CHEST - 2 VIEW COMPARISON:  Chest x-ray December 25, 2009 FINDINGS: The cardiomediastinal silhouette is unchanged in contour. Heterogeneous right basilar pulmonary opacity. The left lung is clear. No pleural effusion or pneumothorax. The visualized upper abdomen is unremarkable. No acute osseous abnormality. IMPRESSION: Right basilar pulmonary opacity, concerning for pneumonia. Electronically Signed   By: Jacob Moores M.D.   On: 03/20/2022 15:00    ____________________________________________   PROCEDURES  Procedure(s) performed:   Procedures  None ____________________________________________   INITIAL IMPRESSION / ASSESSMENT AND PLAN / ED COURSE  Pertinent labs & imaging results that were available  during my care of the patient were reviewed by me and considered in my medical decision making (see chart for details).   This patient is Presenting for Evaluation of HA, which does require a range of treatment options, and is a complaint that involves a  high risk of morbidity and mortality.  The Differential Diagnoses includes but is not exclusive to subarachnoid hemorrhage, meningitis, encephalitis, previous head trauma, cavernous venous thrombosis, muscle tension headache, glaucoma, temporal arteritis, migraine or migraine equivalent, etc.   Critical Interventions-    Medications  sodium chloride 0.9 % bolus 1,000 mL (0 mLs Intravenous Stopped 03/21/22 1530)  ketorolac (TORADOL) 30 MG/ML injection 30 mg (30 mg Intravenous Given 03/21/22 1422)  dexamethasone (DECADRON) injection 10 mg (10 mg Intravenous Given 03/21/22 1422)  magnesium sulfate IVPB 1 g 100 mL (0 g Intravenous Stopped 03/21/22 1531)  ondansetron (ZOFRAN) injection 4 mg (4 mg Intravenous Given 03/21/22 1422)  doxycycline (VIBRA-TABS) tablet 100 mg (100 mg Oral Given 03/21/22 1423)    Reassessment after intervention: HA improved.    I decided to review pertinent External Data, and in summary patient with MSE and CXR from [facility] yesterday.   Clinical Laboratory Tests Ordered, included LFTs mildly elevated with normal bilirubin. Lipase normal. No anemia.   Radiologic Tests: Independently interpreted yesterday's CXR.    Social Determinants of Health Risk patient is a smoker.   Medical Decision Making: Summary:  Patient presents to the ED with cough/flu-like symptoms and HA. Hemoptysis seems most typical of bronchitis. Considered PE but given presentation, doubt this. Defer CTA. Patient's HA is typical of migraine for him and greatly improved with meds here. Defer neuro imaging and evaluation for CNS infection.   Reevaluation with update and discussion with patient. Symptoms greatly improved. LFT's mildly elevated but bili is normal. Abdomen soft. Will need PCP follow up for re-check of LFTs. Discussed with patient.   Considered admission but symptoms are greatly improved. Plan for bronchitis mgmt and abx given CXR from yesterday.   Patient's presentation is most  consistent with acute presentation with potential threat to life or bodily function.   Disposition: discharge  ____________________________________________  FINAL CLINICAL IMPRESSION(S) / ED DIAGNOSES  Final diagnoses:  Community acquired pneumonia of right lower lobe of lung  Other migraine without status migrainosus, not intractable     NEW OUTPATIENT MEDICATIONS STARTED DURING THIS VISIT:  Discharge Medication List as of 03/21/2022  3:17 PM     START taking these medications   Details  albuterol (VENTOLIN HFA) 108 (90 Base) MCG/ACT inhaler Inhale 1-2 puffs into the lungs every 6 (six) hours as needed for wheezing or shortness of breath., Starting Sun 03/21/2022, Normal    benzonatate (TESSALON) 100 MG capsule Take 1 capsule (100 mg total) by mouth 3 (three) times Zhang as needed for cough., Starting Sun 03/21/2022, Normal    doxycycline (VIBRAMYCIN) 100 MG capsule Take 1 capsule (100 mg total) by mouth 2 (two) times Zhang for 7 days., Starting Sun 03/21/2022, Until Sun 03/28/2022, Normal        Note:  This document was prepared using Dragon voice recognition software and may include unintentional dictation errors.  Alona Bene, MD, [facility] Emergency Medicine    Sama Arauz, Arlyss Repress, MD 03/28/22 [phone]

## 2022-03-21 NOTE — ED Notes (Signed)
Sick for 9 days, had sinus infection week prior to Flu dx. Sx resolved for short time and fever 8 days ago. Initial 102.365F, 100.65F yesterday. Hx of migraines, trying to avoid causing other issues.

## 2022-03-21 NOTE — ED Triage Notes (Signed)
Pt arrives pov, endorses blood tinged mucus last night, cough that is not responding to otc meds.  Also c/o "migraine" x 1 week. Pt aox4. Pt endorses decreased appetite as well. Chest xray yesterday, LWBS from [facility] yesterday

## 2022-03-21 NOTE — Discharge Instructions (Signed)
You were seen in the emergency room today with cough and fever.  Your x-ray from yesterday showing some developing pneumonia and I am starting you on antibiotics which should help your symptoms.  I will take you to work for the next week to help get some rest.  Please drink plenty of fluids.  I have also called in some medicine for cough and an albuterol inhaler which she can use for shortness of breath/wheezing or severe coughing.  Please take medication as directed.  If you develop any new or worsening symptoms such as severe chest pain or worsening shortness of breath you should return for reevaluation.

## 2022-04-13 ENCOUNTER — Other Ambulatory Visit: Payer: Self-pay | Admitting: Internal Medicine

## 2022-04-13 DIAGNOSIS — F988 Other specified behavioral and emotional disorders with onset usually occurring in childhood and adolescence: Secondary | ICD-10-CM

## 2022-05-26 DIAGNOSIS — J019 Acute sinusitis, unspecified: Secondary | ICD-10-CM | POA: Diagnosis not present

## 2022-05-26 DIAGNOSIS — R051 Acute cough: Secondary | ICD-10-CM | POA: Diagnosis not present

## 2022-12-13 DIAGNOSIS — R590 Localized enlarged lymph nodes: Secondary | ICD-10-CM | POA: Diagnosis not present

## 2023-02-02 ENCOUNTER — Telehealth: Payer: BC Managed Care – PPO | Admitting: Physician Assistant

## 2023-02-02 DIAGNOSIS — J208 Acute bronchitis due to other specified organisms: Secondary | ICD-10-CM | POA: Diagnosis not present

## 2023-02-02 MED ORDER — ALBUTEROL SULFATE HFA 108 (90 BASE) MCG/ACT IN AERS: 1.0000 | INHALATION_SPRAY | Freq: Four times a day (QID) | RESPIRATORY_TRACT | 0 refills | Status: AC | PRN

## 2023-02-02 MED ORDER — PREDNISONE 20 MG PO TABS: 40.0000 mg | ORAL_TABLET | Freq: Every day | ORAL | 0 refills | Status: AC

## 2023-02-02 MED ORDER — BENZONATATE 100 MG PO CAPS: 100.0000 mg | ORAL_CAPSULE | Freq: Three times a day (TID) | ORAL | 0 refills | Status: AC | PRN

## 2023-02-02 NOTE — Progress Notes (Signed)
E-Visit for Cough   We are sorry that you are not feeling well.  Here is how we plan to help!  Based on your presentation I believe you most likely have A cough due to a virus.  This is called viral bronchitis and is best treated by rest, plenty of fluids and control of the cough.  You may use Ibuprofen or Tylenol as directed to help your symptoms.     In addition you may use A prescription cough medication called Tessalon Perles 100mg . You may take 1-2 capsules every 8 hours as needed for your cough.  I have also refilled your albuterol inhaler and added on a 5-day burst of prednisone to help reduce airway inflammation and hopefully speed recovery.  From your responses in the eVisit questionnaire you describe inflammation in the upper respiratory tract which is causing a significant cough.  This is commonly called Bronchitis and has four common causes:   Allergies Viral Infections Acid Reflux Bacterial Infection Allergies, viruses and acid reflux are treated by controlling symptoms or eliminating the cause. An example might be a cough caused by taking certain blood pressure medications. You stop the cough by changing the medication. Another example might be a cough caused by acid reflux. Controlling the reflux helps control the cough.  USE OF BRONCHODILATOR ("RESCUE") INHALERS: There is a risk from using your bronchodilator too frequently.  The risk is that over-reliance on a medication which only relaxes the muscles surrounding the breathing tubes can reduce the effectiveness of medications prescribed to reduce swelling and congestion of the tubes themselves.  Although you feel brief relief from the bronchodilator inhaler, your asthma may actually be worsening with the tubes becoming more swollen and filled with mucus.  This can delay other crucial treatments, such as oral steroid medications. If you need to use a bronchodilator inhaler daily, several times per day, you should discuss this with  your provider.  There are probably better treatments that could be used to keep your asthma under control.     HOME CARE Only take medications as instructed by your medical team. Complete the entire course of an antibiotic. Drink plenty of fluids and get plenty of rest. Avoid close contacts especially the very young and the elderly Cover your mouth if you cough or cough into your sleeve. Always remember to wash your hands A steam or ultrasonic humidifier can help congestion.   GET HELP RIGHT AWAY IF: You develop worsening fever. You become short of breath You cough up blood. Your symptoms persist after you have completed your treatment plan MAKE SURE YOU  Understand these instructions. Will watch your condition. Will get help right away if you are not doing well or get worse.    Thank you for choosing an e-visit.  Your e-visit answers were reviewed by a board certified advanced clinical practitioner to complete your personal care plan. Depending upon the condition, your plan could have included both over the counter or prescription medications.  Please review your pharmacy choice. Make sure the pharmacy is open so you can pick up prescription now. If there is a problem, you may contact your provider through [employer] and have the prescription routed to another pharmacy.  Your safety is important to [place]. If you have drug allergies check your prescription carefully.   For the next 24 hours you can use MyChart to ask questions about today's visit, request a non-urgent call back, or ask for a work or school excuse. You will get  an email in the next two days asking about your experience. I hope that your e-visit has been valuable and will speed your recovery.

## 2023-02-02 NOTE — Progress Notes (Signed)
I have spent 5 minutes in review of e-visit questionnaire, review and updating patient chart, medical decision making and response to patient.   Mia Milan Cody Jacklynn Dehaas, PA-C    

## 2023-02-11 DIAGNOSIS — H538 Other visual disturbances: Secondary | ICD-10-CM | POA: Diagnosis not present

## 2023-02-11 DIAGNOSIS — G43909 Migraine, unspecified, not intractable, without status migrainosus: Secondary | ICD-10-CM | POA: Diagnosis not present

## 2023-02-11 DIAGNOSIS — I10 Essential (primary) hypertension: Secondary | ICD-10-CM | POA: Diagnosis not present

## 2023-02-11 DIAGNOSIS — R519 Headache, unspecified: Secondary | ICD-10-CM | POA: Diagnosis not present

## 2023-09-29 DIAGNOSIS — F909 Attention-deficit hyperactivity disorder, unspecified type: Secondary | ICD-10-CM | POA: Diagnosis not present

## 2023-09-30 DIAGNOSIS — F900 Attention-deficit hyperactivity disorder, predominantly inattentive type: Secondary | ICD-10-CM | POA: Diagnosis not present

## 2023-09-30 DIAGNOSIS — Z79899 Other long term (current) drug therapy: Secondary | ICD-10-CM | POA: Diagnosis not present

## 2023-10-20 DIAGNOSIS — E7841 Elevated Lipoprotein(a): Secondary | ICD-10-CM | POA: Diagnosis not present

## 2023-10-20 DIAGNOSIS — Z13220 Encounter for screening for lipoid disorders: Secondary | ICD-10-CM | POA: Diagnosis not present

## 2023-10-20 DIAGNOSIS — Z131 Encounter for screening for diabetes mellitus: Secondary | ICD-10-CM | POA: Diagnosis not present

## 2023-10-20 DIAGNOSIS — F900 Attention-deficit hyperactivity disorder, predominantly inattentive type: Secondary | ICD-10-CM | POA: Diagnosis not present

## 2023-10-20 DIAGNOSIS — Z79899 Other long term (current) drug therapy: Secondary | ICD-10-CM | POA: Diagnosis not present

## 2023-10-20 DIAGNOSIS — R002 Palpitations: Secondary | ICD-10-CM | POA: Diagnosis not present

## 2023-11-14 ENCOUNTER — Other Ambulatory Visit: Payer: Self-pay

## 2023-11-14 ENCOUNTER — Emergency Department (HOSPITAL_BASED_OUTPATIENT_CLINIC_OR_DEPARTMENT_OTHER): Admitting: Radiology

## 2023-11-14 ENCOUNTER — Emergency Department (HOSPITAL_BASED_OUTPATIENT_CLINIC_OR_DEPARTMENT_OTHER)
Admission: EM | Admit: 2023-11-14 | Discharge: 2023-11-15 | Disposition: A | Discharge status: Home / Self Care | Attending: Emergency Medicine | Admitting: Emergency Medicine

## 2023-11-14 ENCOUNTER — Emergency Department (HOSPITAL_BASED_OUTPATIENT_CLINIC_OR_DEPARTMENT_OTHER)

## 2023-11-14 DIAGNOSIS — I10 Essential (primary) hypertension: Secondary | ICD-10-CM | POA: Diagnosis not present

## 2023-11-14 DIAGNOSIS — R002 Palpitations: Secondary | ICD-10-CM | POA: Diagnosis not present

## 2023-11-14 DIAGNOSIS — R079 Chest pain, unspecified: Secondary | ICD-10-CM | POA: Insufficient documentation

## 2023-11-14 DIAGNOSIS — R42 Dizziness and giddiness: Secondary | ICD-10-CM | POA: Insufficient documentation

## 2023-11-14 DIAGNOSIS — R0602 Shortness of breath: Secondary | ICD-10-CM | POA: Insufficient documentation

## 2023-11-14 DIAGNOSIS — R0600 Dyspnea, unspecified: Secondary | ICD-10-CM

## 2023-11-14 DIAGNOSIS — R7989 Other specified abnormal findings of blood chemistry: Secondary | ICD-10-CM | POA: Diagnosis not present

## 2023-11-14 LAB — CBC
HCT: 46.3 % (ref 39.0–52.0)
Hemoglobin: 15.9 g/dL (ref 13.0–17.0)
MCH: 31.7 pg (ref 26.0–34.0)
MCHC: 34.3 g/dL (ref 30.0–36.0)
MCV: 92.4 fL (ref 80.0–100.0)
Platelets: 242 K/uL (ref 150–400)
RBC: 5.01 MIL/uL (ref 4.22–5.81)
RDW: 13.1 % (ref 11.5–15.5)
WBC: 5.6 K/uL (ref 4.0–10.5)
nRBC: 0 % (ref 0.0–0.2)

## 2023-11-14 LAB — BASIC METABOLIC PANEL WITH GFR
Anion gap: 10 (ref 5–15)
BUN: 11 mg/dL (ref 6–20)
CO2: 28 mmol/L (ref 22–32)
Calcium: 9.8 mg/dL (ref 8.9–10.3)
Chloride: 103 mmol/L (ref 98–111)
Creatinine, Ser: 1.28 mg/dL — ABNORMAL HIGH (ref 0.61–1.24)
GFR, Estimated: >60 mL/min (ref >60)
Glucose, Bld: 78 mg/dL (ref 70–99)
Potassium: 3.9 mmol/L (ref 3.5–5.1)
Sodium: 142 mmol/L (ref 135–145)

## 2023-11-14 LAB — TROPONIN T, HIGH SENSITIVITY: Troponin T High Sensitivity: <15 ng/L (ref <19)

## 2023-11-14 MED ORDER — SODIUM CHLORIDE 0.9 % IV BOLUS
1000.0000 mL | Freq: Once | INTRAVENOUS | Status: AC
  Administered 2023-11-14 (×2): 1000 mL via INTRAVENOUS

## 2023-11-14 MED ORDER — IOHEXOL 350 MG/ML SOLN
75.0000 mL | Freq: Once | INTRAVENOUS | Status: AC | PRN
  Administered 2023-11-14 (×2): 75 mL via INTRAVENOUS

## 2023-11-14 NOTE — ED Provider Notes (Signed)
 [facility] EMERGENCY DEPARTMENT AT [facility] Provider Note   CSN: [id number] Arrival date & time: 11/14/23  1800     Patient presents with: Hypertension   Tyler Zhang is a 33 y.o. male.   HPI   33 year old male presents emergency department with multiple complaints.  For over a month patient has been experiencing episodes of shortness of breath, near syncope, lightheadedness and racing heartbeat.  He has been taking his blood pressure at home and keeping a log.  He has been noting episodes of hypertension but mainly ongoing tachycardia.  Denies any leg swelling or history of DVT.  Expresses concern for strong family history in his grandpa as well as father of cardiac disease at a young age.  At this time patient feels slightly short of breath.  He had called EMS earlier and when they did an EKG they thought they saw changes concerning for hyperkalemia and recommended that he comes in for evaluation.  Prior to Admission medications   Medication Sig Start Date End Date Taking? Authorizing Provider  albuterol  (VENTOLIN  HFA) 108 (90 Base) MCG/ACT inhaler Inhale 1-2 puffs into the lungs every 6 (six) hours as needed for wheezing or shortness of breath. 02/02/23   Gladis Elsie BROCKS, PA-C  amphetamine -dextroamphetamine  (ADDERALL XR) 20 MG 24 hr capsule Take 1 capsule (20 mg total) by mouth every morning. 06/13/13   Tish Elsie FALCON, MD  amphetamine -dextroamphetamine  (ADDERALL) 10 MG tablet 1 qd in afternoon as needed 06/13/13   Tish Elsie FALCON, MD  benzonatate  (TESSALON ) 100 MG capsule Take 1 capsule (100 mg total) by mouth 3 (three) times daily as needed for cough. 02/02/23   Gladis Elsie BROCKS, PA-C  predniSONE  (DELTASONE ) 20 MG tablet Take 2 tablets (40 mg total) by mouth daily with breakfast. 02/02/23   Gladis Elsie BROCKS, PA-C    Allergies: Patient has no known allergies.    Review of Systems  Constitutional:  Positive for fatigue. Negative for fever.  Respiratory:   Positive for shortness of breath.   Cardiovascular:  Positive for chest pain and palpitations. Negative for leg swelling.  Gastrointestinal:  Negative for abdominal pain, diarrhea and vomiting.  Skin:  Negative for rash.  Neurological:  Positive for light-headedness. Negative for headaches.    Updated Vital Signs BP (!) 120/98   Pulse (!) 119   Temp 98.4 F (36.9 C) (Oral)   Resp 20   SpO2 100%   Physical Exam Vitals and nursing note reviewed.  Constitutional:      Appearance: Normal appearance.  HENT:     Head: Normocephalic.     Mouth/Throat:     Mouth: Mucous membranes are moist.  Cardiovascular:     Rate and Rhythm: Tachycardia present.  Pulmonary:     Effort: Pulmonary effort is normal. No respiratory distress.  Abdominal:     Palpations: Abdomen is soft.     Tenderness: There is no abdominal tenderness.  Musculoskeletal:        General: No swelling.  Skin:    General: Skin is warm.  Neurological:     Mental Status: He is alert and oriented to person, place, and time. Mental status is at baseline.  Psychiatric:        Mood and Affect: Mood normal.     (all labs ordered are listed, but only abnormal results are displayed) Labs Reviewed  BASIC METABOLIC PANEL WITH GFR - Abnormal; Notable for the following components:      Result Value   Creatinine, Ser  1.28 (*)    All other components within normal limits  CBC  TROPONIN T, HIGH SENSITIVITY  TROPONIN T, HIGH SENSITIVITY    EKG: None  Radiology: DG Chest 2 View Result Date: 11/14/2023 CLINICAL DATA:  Chest pain and hypertension EXAM: CHEST - 2 VIEW COMPARISON:  03/20/2022 FINDINGS: Cardiac shadow is within normal limits. The lungs are hyperinflated bilaterally. No infiltrate or effusion is seen. No parenchymal abnormality is noted. No bony abnormality is seen. IMPRESSION: Hyperinflation without acute abnormality. Electronically Signed   By: Oneil Devonshire M.D.   On: 11/14/2023 19:54     Procedures    Medications Ordered in the ED  sodium chloride  0.9 % bolus 1,000 mL (has no administration in time range)                                    Medical Decision Making Amount and/or Complexity of Data Reviewed Labs: ordered. Radiology: ordered.   33 year old male presents emergency department with multiple episodes of lightheadedness, palpitations, racing heartbeat, shortness of breath, intermittent chest pain.  Is being evaluated by his PCP and being referred to cardiology for further workup.  Came in tonight after EMS was called and the EKG was concerning for peaked T waves/hyperkalemia.  On my evaluation patient is laying in bed, speaking in full sentences.  Seems slightly anxious but is otherwise pleasant.  He is tachycardic without any tachypnea.  EKG does have peaked T waves but this seems to be unchanged.  Initial blood work is reassuring, potassium is normal.  Initial troponin is negative.  Repeat is pending.  Chest x-ray is unremarkable.  After discussing with the patient given the above complaints I feel it is necessary to rule out PE.  We will pursue CT scanning and in the process rule out other acute diagnoses including pericardial effusion.  Patient signed out pending CT.     Final diagnoses:  None    ED Discharge Orders     None          Bari Roxie HERO, DO 11/14/23 2349

## 2023-11-14 NOTE — ED Triage Notes (Addendum)
 Patient reports being seen by his PCP for cardiac issues. Told to keep BP log. Home BP cuffs giving high readings. Patient said EMS did EKG and was concerned for hyperkalemia. States syncopal episode last month and ongoing cardiac concerns since. Upper epigastric pressure.

## 2023-11-14 NOTE — ED Notes (Signed)
 Additional Blue and Gold tubes sent to lab to hold.

## 2023-11-15 DIAGNOSIS — R079 Chest pain, unspecified: Secondary | ICD-10-CM | POA: Diagnosis not present

## 2023-11-15 DIAGNOSIS — R7989 Other specified abnormal findings of blood chemistry: Secondary | ICD-10-CM | POA: Diagnosis not present

## 2023-11-15 LAB — URINALYSIS, ROUTINE W REFLEX MICROSCOPIC
Bilirubin Urine: NEGATIVE
Glucose, UA: NEGATIVE mg/dL
Hgb urine dipstick: NEGATIVE
Ketones, ur: NEGATIVE mg/dL
Leukocytes,Ua: NEGATIVE
Nitrite: NEGATIVE
Protein, ur: NEGATIVE mg/dL
Specific Gravity, Urine: 1.02 (ref 1.005–1.030)
pH: 7 (ref 5.0–8.0)

## 2023-11-15 LAB — TROPONIN T, HIGH SENSITIVITY: Troponin T High Sensitivity: <15 ng/L (ref <19)

## 2023-11-15 NOTE — ED Provider Notes (Signed)
  Physical Exam  BP 125/77   Pulse 84   Temp 98 F (36.7 C) (Oral)   Resp 16   SpO2 100%   Physical Exam Vitals and nursing note reviewed.  Constitutional:      Appearance: Normal appearance.  Pulmonary:     Effort: Pulmonary effort is normal.  Skin:    General: Skin is warm and dry.  Neurological:     Mental Status: He is alert.     Procedures  Procedures  ED Course / MDM    Medical Decision Making Amount and/or Complexity of Data Reviewed Labs: ordered. Radiology: ordered.  Risk Prescription drug management.   Patient is a 33 year old male signed out awaiting results of a CTA of the chest to rule out PE.  This study has resulted and is negative.  Patient reassessed and feeling better.  He has an upcoming appointment with cardiology in the near future and I have advised him to keep this appointment.  To return as needed for any problems.       Geroldine Berg, MD 11/15/23 [phone]

## 2023-11-15 NOTE — Discharge Instructions (Signed)
 Follow-up with your cardiologist as previously scheduled.  Return to the ER if your symptoms significantly worsen or change.

## 2023-11-16 DIAGNOSIS — R300 Dysuria: Secondary | ICD-10-CM | POA: Diagnosis not present

## 2023-11-16 DIAGNOSIS — R8290 Unspecified abnormal findings in urine: Secondary | ICD-10-CM | POA: Diagnosis not present

## 2023-11-25 DIAGNOSIS — R002 Palpitations: Secondary | ICD-10-CM | POA: Diagnosis not present

## 2023-11-25 DIAGNOSIS — R0609 Other forms of dyspnea: Secondary | ICD-10-CM | POA: Diagnosis not present

## 2023-11-25 DIAGNOSIS — Z8249 Family history of ischemic heart disease and other diseases of the circulatory system: Secondary | ICD-10-CM | POA: Diagnosis not present

## 2023-11-25 DIAGNOSIS — R300 Dysuria: Secondary | ICD-10-CM | POA: Diagnosis not present

## 2023-12-02 DIAGNOSIS — R61 Generalized hyperhidrosis: Secondary | ICD-10-CM | POA: Diagnosis not present

## 2023-12-02 DIAGNOSIS — R531 Weakness: Secondary | ICD-10-CM | POA: Diagnosis not present

## 2023-12-02 DIAGNOSIS — R000 Tachycardia, unspecified: Secondary | ICD-10-CM | POA: Diagnosis not present

## 2023-12-02 DIAGNOSIS — R0789 Other chest pain: Secondary | ICD-10-CM | POA: Diagnosis not present

## 2023-12-02 DIAGNOSIS — R0602 Shortness of breath: Secondary | ICD-10-CM | POA: Diagnosis not present

## 2023-12-02 DIAGNOSIS — R55 Syncope and collapse: Secondary | ICD-10-CM | POA: Diagnosis not present

## 2023-12-02 DIAGNOSIS — R079 Chest pain, unspecified: Secondary | ICD-10-CM | POA: Diagnosis not present

## 2023-12-03 DIAGNOSIS — R0789 Other chest pain: Secondary | ICD-10-CM | POA: Diagnosis not present

## 2023-12-06 DIAGNOSIS — R079 Chest pain, unspecified: Secondary | ICD-10-CM | POA: Diagnosis not present

## 2023-12-06 DIAGNOSIS — Z8249 Family history of ischemic heart disease and other diseases of the circulatory system: Secondary | ICD-10-CM | POA: Diagnosis not present

## 2023-12-06 DIAGNOSIS — R0609 Other forms of dyspnea: Secondary | ICD-10-CM | POA: Diagnosis not present

## 2023-12-06 DIAGNOSIS — R000 Tachycardia, unspecified: Secondary | ICD-10-CM | POA: Diagnosis not present

## 2023-12-06 DIAGNOSIS — Z136 Encounter for screening for cardiovascular disorders: Secondary | ICD-10-CM | POA: Diagnosis not present

## 2023-12-06 DIAGNOSIS — R002 Palpitations: Secondary | ICD-10-CM | POA: Diagnosis not present

## 2023-12-09 DIAGNOSIS — R0609 Other forms of dyspnea: Secondary | ICD-10-CM | POA: Diagnosis not present

## 2023-12-09 DIAGNOSIS — Z0389 Encounter for observation for other suspected diseases and conditions ruled out: Secondary | ICD-10-CM | POA: Diagnosis not present

## 2023-12-09 DIAGNOSIS — R002 Palpitations: Secondary | ICD-10-CM | POA: Diagnosis not present

## 2023-12-09 DIAGNOSIS — Z8249 Family history of ischemic heart disease and other diseases of the circulatory system: Secondary | ICD-10-CM | POA: Diagnosis not present

## 2023-12-16 DIAGNOSIS — Z8249 Family history of ischemic heart disease and other diseases of the circulatory system: Secondary | ICD-10-CM | POA: Diagnosis not present

## 2023-12-16 DIAGNOSIS — R002 Palpitations: Secondary | ICD-10-CM | POA: Diagnosis not present

## 2023-12-24 DIAGNOSIS — R002 Palpitations: Secondary | ICD-10-CM | POA: Diagnosis not present

## 2023-12-24 DIAGNOSIS — R0609 Other forms of dyspnea: Secondary | ICD-10-CM | POA: Diagnosis not present

## 2023-12-24 DIAGNOSIS — Z8249 Family history of ischemic heart disease and other diseases of the circulatory system: Secondary | ICD-10-CM | POA: Diagnosis not present

## 2024-02-06 DIAGNOSIS — F900 Attention-deficit hyperactivity disorder, predominantly inattentive type: Secondary | ICD-10-CM | POA: Diagnosis not present

## 2024-03-06 DIAGNOSIS — R0609 Other forms of dyspnea: Secondary | ICD-10-CM | POA: Diagnosis not present

## 2024-03-06 DIAGNOSIS — I4711 Inappropriate sinus tachycardia, so stated: Secondary | ICD-10-CM | POA: Diagnosis not present

## 2024-03-06 DIAGNOSIS — Z8249 Family history of ischemic heart disease and other diseases of the circulatory system: Secondary | ICD-10-CM | POA: Diagnosis not present

## 2024-03-06 DIAGNOSIS — E7841 Elevated Lipoprotein(a): Secondary | ICD-10-CM | POA: Diagnosis not present
